# Patient Record
Sex: Female | Born: 1975 | Race: Asian | Hispanic: No | Marital: Single | Smoking: Never smoker
Health system: Southern US, Community
[De-identification: ages and names within clinical notes are randomized; demographics above are authoritative.]

## PROBLEM LIST (undated history)

## (undated) DIAGNOSIS — R0600 Dyspnea, unspecified: Secondary | ICD-10-CM

## (undated) DIAGNOSIS — S0990XS Unspecified injury of head, sequela: Secondary | ICD-10-CM

## (undated) DIAGNOSIS — K219 Gastro-esophageal reflux disease without esophagitis: Secondary | ICD-10-CM

## (undated) DIAGNOSIS — G44309 Post-traumatic headache, unspecified, not intractable: Secondary | ICD-10-CM

## (undated) HISTORY — DX: Post-traumatic headache, unspecified, not intractable: G44.309

## (undated) HISTORY — PX: PLACEMENT OF BREAST IMPLANTS: SHX6334

## (undated) HISTORY — DX: Unspecified injury of head, sequela: S09.90XS

## (undated) HISTORY — DX: Gastro-esophageal reflux disease without esophagitis: K21.9

---

## 1999-02-03 ENCOUNTER — Inpatient Hospital Stay (HOSPITAL_COMMUNITY): Admission: AD | Admit: 1999-02-03 | Discharge: 1999-02-03 | Payer: Self-pay | Admitting: Obstetrics & Gynecology

## 1999-02-08 ENCOUNTER — Inpatient Hospital Stay (HOSPITAL_COMMUNITY): Admission: AD | Admit: 1999-02-08 | Discharge: 1999-02-08 | Payer: Self-pay | Admitting: Obstetrics and Gynecology

## 1999-10-30 ENCOUNTER — Encounter: Admission: RE | Admit: 1999-10-30 | Discharge: 1999-10-30 | Payer: Self-pay | Admitting: Obstetrics & Gynecology

## 1999-11-12 ENCOUNTER — Ambulatory Visit (HOSPITAL_COMMUNITY): Admission: RE | Admit: 1999-11-12 | Discharge: 1999-11-12 | Payer: Self-pay | Admitting: Obstetrics & Gynecology

## 1999-11-13 ENCOUNTER — Encounter: Admission: RE | Admit: 1999-11-13 | Discharge: 1999-11-13 | Payer: Self-pay | Admitting: Obstetrics & Gynecology

## 1999-11-27 ENCOUNTER — Encounter: Admission: RE | Admit: 1999-11-27 | Discharge: 1999-11-27 | Payer: Self-pay | Admitting: Obstetrics & Gynecology

## 1999-11-27 ENCOUNTER — Ambulatory Visit (HOSPITAL_COMMUNITY): Admission: RE | Admit: 1999-11-27 | Discharge: 1999-11-27 | Payer: Self-pay

## 1999-12-03 ENCOUNTER — Ambulatory Visit (HOSPITAL_COMMUNITY): Admission: RE | Admit: 1999-12-03 | Discharge: 1999-12-03 | Payer: Self-pay | Admitting: Obstetrics & Gynecology

## 1999-12-18 ENCOUNTER — Encounter: Admission: RE | Admit: 1999-12-18 | Discharge: 1999-12-18 | Payer: Self-pay | Admitting: Obstetrics & Gynecology

## 1999-12-26 ENCOUNTER — Ambulatory Visit (HOSPITAL_COMMUNITY): Admission: RE | Admit: 1999-12-26 | Discharge: 1999-12-26 | Payer: Self-pay

## 2000-01-08 ENCOUNTER — Encounter (HOSPITAL_COMMUNITY): Admission: RE | Admit: 2000-01-08 | Discharge: 2000-04-07 | Payer: Self-pay | Admitting: Obstetrics & Gynecology

## 2000-01-08 ENCOUNTER — Encounter: Admission: RE | Admit: 2000-01-08 | Discharge: 2000-01-08 | Payer: Self-pay | Admitting: Obstetrics & Gynecology

## 2000-01-15 ENCOUNTER — Encounter: Admission: RE | Admit: 2000-01-15 | Discharge: 2000-01-15 | Payer: Self-pay | Admitting: Obstetrics & Gynecology

## 2000-01-22 ENCOUNTER — Encounter: Admission: RE | Admit: 2000-01-22 | Discharge: 2000-01-22 | Payer: Self-pay | Admitting: Obstetrics & Gynecology

## 2000-02-05 ENCOUNTER — Encounter: Admission: RE | Admit: 2000-02-05 | Discharge: 2000-02-05 | Payer: Self-pay | Admitting: Obstetrics & Gynecology

## 2000-02-19 ENCOUNTER — Encounter: Admission: RE | Admit: 2000-02-19 | Discharge: 2000-02-19 | Payer: Self-pay | Admitting: Obstetrics & Gynecology

## 2000-02-24 ENCOUNTER — Encounter: Admission: RE | Admit: 2000-02-24 | Discharge: 2000-02-24 | Payer: Self-pay | Admitting: Obstetrics & Gynecology

## 2000-03-04 ENCOUNTER — Encounter: Admission: RE | Admit: 2000-03-04 | Discharge: 2000-03-04 | Payer: Self-pay | Admitting: Obstetrics & Gynecology

## 2000-03-05 ENCOUNTER — Ambulatory Visit (HOSPITAL_COMMUNITY): Admission: RE | Admit: 2000-03-05 | Discharge: 2000-03-05 | Payer: Self-pay | Admitting: Obstetrics

## 2000-03-18 ENCOUNTER — Encounter: Admission: RE | Admit: 2000-03-18 | Discharge: 2000-03-18 | Payer: Self-pay | Admitting: Obstetrics & Gynecology

## 2000-03-25 ENCOUNTER — Encounter: Admission: RE | Admit: 2000-03-25 | Discharge: 2000-03-25 | Payer: Self-pay | Admitting: Obstetrics & Gynecology

## 2000-03-25 ENCOUNTER — Inpatient Hospital Stay (HOSPITAL_COMMUNITY): Admission: AD | Admit: 2000-03-25 | Discharge: 2000-03-29 | Payer: Self-pay | Admitting: Obstetrics

## 2000-03-27 ENCOUNTER — Encounter: Payer: Self-pay | Admitting: *Deleted

## 2000-04-01 ENCOUNTER — Encounter: Admission: RE | Admit: 2000-04-01 | Discharge: 2000-04-01 | Payer: Self-pay | Admitting: Obstetrics & Gynecology

## 2000-04-08 ENCOUNTER — Encounter: Admission: RE | Admit: 2000-04-08 | Discharge: 2000-04-08 | Payer: Self-pay | Admitting: Obstetrics & Gynecology

## 2000-04-19 ENCOUNTER — Observation Stay (HOSPITAL_COMMUNITY): Admission: AD | Admit: 2000-04-19 | Discharge: 2000-04-20 | Payer: Self-pay | Admitting: Obstetrics & Gynecology

## 2000-04-21 ENCOUNTER — Observation Stay (HOSPITAL_COMMUNITY): Admission: AD | Admit: 2000-04-21 | Discharge: 2000-04-21 | Payer: Self-pay | Admitting: Obstetrics & Gynecology

## 2000-04-23 ENCOUNTER — Inpatient Hospital Stay (HOSPITAL_COMMUNITY): Admission: AD | Admit: 2000-04-23 | Discharge: 2000-04-23 | Payer: Self-pay | Admitting: *Deleted

## 2000-04-25 ENCOUNTER — Inpatient Hospital Stay (HOSPITAL_COMMUNITY): Admission: AD | Admit: 2000-04-25 | Discharge: 2000-04-27 | Payer: Self-pay | Admitting: Obstetrics

## 2001-11-01 ENCOUNTER — Other Ambulatory Visit: Admission: RE | Admit: 2001-11-01 | Discharge: 2001-11-01 | Payer: Self-pay | Admitting: Obstetrics & Gynecology

## 2001-11-05 ENCOUNTER — Encounter: Payer: Self-pay | Admitting: Obstetrics & Gynecology

## 2001-11-05 ENCOUNTER — Ambulatory Visit (HOSPITAL_COMMUNITY): Admission: RE | Admit: 2001-11-05 | Discharge: 2001-11-05 | Payer: Self-pay | Admitting: Obstetrics & Gynecology

## 2001-11-10 ENCOUNTER — Ambulatory Visit (HOSPITAL_COMMUNITY): Admission: RE | Admit: 2001-11-10 | Discharge: 2001-11-10 | Payer: Self-pay | Admitting: Obstetrics & Gynecology

## 2001-11-25 ENCOUNTER — Ambulatory Visit (HOSPITAL_COMMUNITY): Admission: RE | Admit: 2001-11-25 | Discharge: 2001-11-25 | Payer: Self-pay | Admitting: Obstetrics & Gynecology

## 2001-11-25 ENCOUNTER — Encounter: Payer: Self-pay | Admitting: Obstetrics & Gynecology

## 2001-12-08 ENCOUNTER — Ambulatory Visit (HOSPITAL_COMMUNITY): Admission: RE | Admit: 2001-12-08 | Discharge: 2001-12-08 | Payer: Self-pay | Admitting: Obstetrics & Gynecology

## 2001-12-08 ENCOUNTER — Encounter: Payer: Self-pay | Admitting: Obstetrics & Gynecology

## 2002-01-12 ENCOUNTER — Ambulatory Visit (HOSPITAL_COMMUNITY): Admission: RE | Admit: 2002-01-12 | Discharge: 2002-01-12 | Payer: Self-pay | Admitting: Obstetrics & Gynecology

## 2002-01-12 ENCOUNTER — Encounter: Payer: Self-pay | Admitting: Obstetrics & Gynecology

## 2002-01-24 ENCOUNTER — Inpatient Hospital Stay (HOSPITAL_COMMUNITY): Admission: AD | Admit: 2002-01-24 | Discharge: 2002-01-29 | Payer: Self-pay | Admitting: Obstetrics & Gynecology

## 2002-01-28 ENCOUNTER — Encounter: Payer: Self-pay | Admitting: Obstetrics & Gynecology

## 2002-02-09 ENCOUNTER — Encounter: Payer: Self-pay | Admitting: Obstetrics & Gynecology

## 2002-02-09 ENCOUNTER — Ambulatory Visit (HOSPITAL_COMMUNITY): Admission: RE | Admit: 2002-02-09 | Discharge: 2002-02-09 | Payer: Self-pay | Admitting: Obstetrics & Gynecology

## 2002-03-21 ENCOUNTER — Inpatient Hospital Stay (HOSPITAL_COMMUNITY): Admission: AD | Admit: 2002-03-21 | Discharge: 2002-03-21 | Payer: Self-pay | Admitting: Obstetrics

## 2002-03-28 ENCOUNTER — Observation Stay (HOSPITAL_COMMUNITY): Admission: AD | Admit: 2002-03-28 | Discharge: 2002-03-28 | Payer: Self-pay | Admitting: Obstetrics

## 2002-04-09 ENCOUNTER — Inpatient Hospital Stay (HOSPITAL_COMMUNITY): Admission: AD | Admit: 2002-04-09 | Discharge: 2002-04-12 | Payer: Self-pay | Admitting: Obstetrics

## 2010-03-17 HISTORY — PX: AUGMENTATION MAMMAPLASTY: SUR837

## 2015-08-08 ENCOUNTER — Encounter: Payer: Self-pay | Admitting: Gastroenterology

## 2015-08-22 ENCOUNTER — Ambulatory Visit (INDEPENDENT_AMBULATORY_CARE_PROVIDER_SITE_OTHER): Payer: BLUE CROSS/BLUE SHIELD | Admitting: Neurology

## 2015-08-22 ENCOUNTER — Encounter: Payer: Self-pay | Admitting: Neurology

## 2015-08-22 VITALS — BP 110/80 | HR 88 | Ht 61.0 in | Wt 134.4 lb

## 2015-08-22 DIAGNOSIS — R079 Chest pain, unspecified: Secondary | ICD-10-CM

## 2015-08-22 DIAGNOSIS — R51 Headache: Secondary | ICD-10-CM | POA: Diagnosis not present

## 2015-08-22 DIAGNOSIS — R42 Dizziness and giddiness: Secondary | ICD-10-CM | POA: Diagnosis not present

## 2015-08-22 DIAGNOSIS — H539 Unspecified visual disturbance: Secondary | ICD-10-CM

## 2015-08-22 DIAGNOSIS — R110 Nausea: Secondary | ICD-10-CM | POA: Diagnosis not present

## 2015-08-22 DIAGNOSIS — R55 Syncope and collapse: Secondary | ICD-10-CM

## 2015-08-22 DIAGNOSIS — R519 Headache, unspecified: Secondary | ICD-10-CM

## 2015-08-22 NOTE — Patient Instructions (Addendum)
Remember to drink plenty of fluid, eat healthy meals and do not skip any meals. Try to eat protein with a every meal and eat a healthy snack such as fruit or nuts in between meals. Try to keep a regular sleep-wake schedule and try to exercise daily, particularly in the form of walking, 20-30 minutes a day, if you can.   As far as diagnostic testing: MRI brain, labs, eeg, holter monitor  Our phone number is [phone]. We also have an after hours call service for urgent matters and there is a physician on-call for urgent questions. For any emergencies you know to call 911 or go to the nearest emergency room

## 2015-08-22 NOTE — Progress Notes (Signed)
[facility]    Provider:  Dr Jaynee Eagles Referring Provider: Shanon Rosser, PA-C Primary Care Physician:  Shanon Rosser PA-C  CC:  Syncope  HPI:  Tracey Coleman is a 40 y.o. female here as a referral from Dr. Laverta Baltimore for near-syncope. Past medical history near syncope, headaches. hey have been worsening 2-3 months ago. She has had several episodes, maybe once a month. No previous history of seizures or alteration of consciousness. She was sitting down at her desk and she couldn't see anybody, her vision goes black, or she can see but she can't speak, like she just woke up from being sick, she does not fall, tries to reposition herself, she is frozen, she feels very dizzy, she tries to hold onto where she is, starts sweating, she has chest pain during the episode, then she tales a deep breath. Then she feels shaky, nervous, weak. No confusion afterwards, no loss of consciousness. Episodes last a minute and then resolve. No triggers. Her boyfriend is here and says in 2003 they went to a store and she had to hold onto her boyfriend, she wasn't herself for 5-6 seconds, she was just standing and holding onto her boyfriend and said she couldn't see anything, then she was fine. She has headaches and takes excedrin and she has them every day. The headaches are in the back of the head. Her boyfriend provides much information. Headaches last for 2-3 hours. She has some headaches in the middle of the night. No light sensitivity with the headaches, no sound sensitivity, no nausea or vomiting with the headaches. She has blurry vision. She is supposed to wear her glasses but doesn't. She has not had a cardiology workup. Headaches are not associated with pre-syncopal episodes but she is having them every other day. She has nausea, flushing with  near syncopal episodes. No other focal neurologic deficits.   Review of Systems: Patient complains of symptoms per HPI as well as the following symptoms: Fevers chills,  blurred vision, double vision, chest pain, cough, feeling cold, diarrhea, constipation, trouble swallowing, joint pain, cramps, aching muscles, headache, numbness, weakness, difficulty swallowing, dizziness. Pertinent negatives per HPI. All others negative.   Social History   Social History  . Marital Status: Single    Spouse Name: N/A  . Number of Children: 3  . Years of Education: 11   Occupational History  . [employer]    Social History Main Topics  . Smoking status: Never Smoker   . Smokeless tobacco: Not on file  . Alcohol Use: No  . Drug Use: No  . Sexual Activity: Not on file   Other Topics Concern  . Not on file   Social History Narrative   Lives with parents and kids   Caffeine use: Drink coffee (once per day)    Family History  Problem Relation Age of Onset  . Diabetes Mother   . Hypertension Mother   . Cancer Brother   . Seizures Neg Hx   . Migraines Neg Hx     Past Medical History  Diagnosis Date  . Headaches due to old head injury     Past Surgical History  Procedure Laterality Date  . No past surgeries      Current Outpatient Prescriptions  Medication Sig Dispense Refill  . ergocalciferol (VITAMIN D2) 50000 units capsule Take 50,000 Units by mouth once a week.    . Esomeprazole Magnesium (NEXIUM PO) Take 22.3 mg by mouth daily.     No current facility-administered  medications for this visit.    Allergies as of 08/22/2015  . (No Known Allergies)    Vitals: BP 110/80 mmHg  Pulse 88  Ht 5\' 1"  (1.549 m)  Wt 134 lb 6.4 oz (60.963 kg)  BMI 25.41 kg/m2  SpO2 98% Last Weight:  Wt Readings from Last 1 Encounters:  08/22/15 134 lb 6.4 oz (60.963 kg)   Last Height:   Ht Readings from Last 1 Encounters:  08/22/15 5\' 1"  (1.549 m)     Physical exam: Exam: Gen: NAD, conversant, well nourised                    CV: RRR, no MRG. No Carotid Bruits. No peripheral edema, warm, nontender Eyes: Conjunctivae clear without exudates or  hemorrhage  Neuro: Detailed Neurologic Exam  Speech:    Speech is normal; fluent and spontaneous with normal comprehension.  Cognition:    The patient is oriented to person, place, and time;     recent and remote memory intact;     language fluent;     normal attention, concentration,     fund of knowledge Cranial Nerves:    The pupils are equal, round, and reactive to light. The fundi are normal and spontaneous venous pulsations are present. Visual fields are full to finger confrontation. Extraocular movements are intact. Trigeminal sensation is intact and the muscles of mastication are normal. The face is symmetric. The palate elevates in the midline. Hearing intact. Voice is normal. Shoulder shrug is normal. The tongue has normal motion without fasciculations.   Coordination:    Normal finger to nose and heel to shin. Normal rapid alternating movements.   Gait:    Heel-toe and tandem gait are normal.   Motor Observation:    No asymmetry, no atrophy, and no involuntary movements noted. Tone:    Normal muscle tone.    Posture:    Posture is normal. normal erect    Strength:    Strength is V/V in the upper and lower limbs.      Sensation: intact to LT     Reflex Exam:  DTR's:    Deep tendon reflexes in the upper and lower extremities are normal bilaterally.   Toes:    The toes are downgoing bilaterally.   Clonus:    Clonus is absent.   Assessment/Plan:  40 year old patient with pre-syncopal episodes with dizziness, chest pain, sweating, vision changes where she almost loses consciousness. I recommend that her primary care have her evaluated for cardiac causes in the meantime I will order a holter monitor for 30 days. Advised her to follow-up with primary care asap for cardiac evaluation as pcp sees clinically warranted, possibly cardiology referal. I will evaluate her for seizures however my suspicion is low.  Asked patient to monitor her fluid intake, keep a diary on  whether she had eaten before the episodes or other causes of pre-syncope. I will order a holter monitor for 30 days.   As far as diagnostic testing: MRI brain, labs, eeg, holter monitor  Patient is unable to drive, operate heavy machinery, perform activities at heights or participate in water activities until 6 months event free  Cc: Long, scott  Sarina Ill, MD  [facility] [address] [place], [place] [address]  Phone [phone] Fax [phone]

## 2015-08-23 LAB — COMPREHENSIVE METABOLIC PANEL
ALT: 6 IU/L (ref 0–32)
AST: 17 IU/L (ref 0–40)
Albumin/Globulin Ratio: 1.3 (ref 1.2–2.2)
Albumin: 4.4 g/dL (ref 3.5–5.5)
Alkaline Phosphatase: 68 IU/L (ref 39–117)
BUN/Creatinine Ratio: 15 (ref 9–23)
BUN: 9 mg/dL (ref 6–20)
Bilirubin Total: <0.2 mg/dL (ref 0.0–1.2)
CALCIUM: 9.6 mg/dL (ref 8.7–10.2)
CHLORIDE: 97 mmol/L (ref 96–106)
CO2: 26 mmol/L (ref 18–29)
Creatinine, Ser: 0.62 mg/dL (ref 0.57–1.00)
GFR, EST AFRICAN AMERICAN: 132 mL/min/{1.73_m2} (ref >59)
GFR, EST NON AFRICAN AMERICAN: 115 mL/min/{1.73_m2} (ref >59)
GLUCOSE: 96 mg/dL (ref 65–99)
Globulin, Total: 3.4 g/dL (ref 1.5–4.5)
Potassium: 3.9 mmol/L (ref 3.5–5.2)
Sodium: 138 mmol/L (ref 134–144)
TOTAL PROTEIN: 7.8 g/dL (ref 6.0–8.5)

## 2015-08-23 LAB — CBC
Hematocrit: 41.4 % (ref 34.0–46.6)
Hemoglobin: 13.7 g/dL (ref 11.1–15.9)
MCH: 28.6 pg (ref 26.6–33.0)
MCHC: 33.1 g/dL (ref 31.5–35.7)
MCV: 86 fL (ref 79–97)
PLATELETS: 296 10*3/uL (ref 150–379)
RBC: 4.79 x10E6/uL (ref 3.77–5.28)
RDW: 14.2 % (ref 12.3–15.4)
WBC: 9.2 10*3/uL (ref 3.4–10.8)

## 2015-08-23 LAB — THYROID PANEL WITH TSH
FREE THYROXINE INDEX: 1.8 (ref 1.2–4.9)
T3 UPTAKE RATIO: 22 % — AB (ref 24–39)
T4, Total: 8.2 ug/dL (ref 4.5–12.0)
TSH: 1.56 u[IU]/mL (ref 0.450–4.500)

## 2015-08-25 ENCOUNTER — Encounter: Payer: Self-pay | Admitting: Neurology

## 2015-08-25 DIAGNOSIS — R55 Syncope and collapse: Secondary | ICD-10-CM | POA: Insufficient documentation

## 2015-08-27 ENCOUNTER — Ambulatory Visit (INDEPENDENT_AMBULATORY_CARE_PROVIDER_SITE_OTHER): Payer: BLUE CROSS/BLUE SHIELD

## 2015-08-27 DIAGNOSIS — R079 Chest pain, unspecified: Secondary | ICD-10-CM | POA: Diagnosis not present

## 2015-08-27 DIAGNOSIS — R55 Syncope and collapse: Secondary | ICD-10-CM | POA: Diagnosis not present

## 2015-08-28 ENCOUNTER — Telehealth: Payer: Self-pay | Admitting: *Deleted

## 2015-08-28 NOTE — Telephone Encounter (Signed)
-----   Message from Melvenia Beam, MD sent at 08/23/2015  7:31 AM EDT ----- Labs look fine. Her kidney function tests are normal, liver function tests normal, her white blood cells, hemoglobin are normal (not anemic), her thyroid function looks good. Please discuss detail with patient as documented here, thanks

## 2015-08-28 NOTE — Telephone Encounter (Signed)
Called and spoke to pt about lab results in detail per Dr Jaynee Eagles note. She is currently hooked up to heart monitor. She has yet to schedule MRI. Told her to call back if she is not called this week to schedule. I want to ensure she has appt. She verbalized understanding.

## 2015-09-05 ENCOUNTER — Ambulatory Visit (INDEPENDENT_AMBULATORY_CARE_PROVIDER_SITE_OTHER): Payer: BLUE CROSS/BLUE SHIELD

## 2015-09-05 DIAGNOSIS — R42 Dizziness and giddiness: Secondary | ICD-10-CM

## 2015-09-05 DIAGNOSIS — H539 Unspecified visual disturbance: Secondary | ICD-10-CM | POA: Diagnosis not present

## 2015-09-05 DIAGNOSIS — R51 Headache: Secondary | ICD-10-CM | POA: Diagnosis not present

## 2015-09-05 DIAGNOSIS — R079 Chest pain, unspecified: Secondary | ICD-10-CM

## 2015-09-05 DIAGNOSIS — R55 Syncope and collapse: Secondary | ICD-10-CM

## 2015-09-05 DIAGNOSIS — R519 Headache, unspecified: Secondary | ICD-10-CM

## 2015-09-05 DIAGNOSIS — R110 Nausea: Secondary | ICD-10-CM

## 2015-09-06 MED ORDER — GADOPENTETATE DIMEGLUMINE 469.01 MG/ML IV SOLN
13.0000 mL | Freq: Once | INTRAVENOUS | Status: AC | PRN
Start: 2015-09-06 — End: ?

## 2015-09-10 ENCOUNTER — Telehealth: Payer: Self-pay | Admitting: *Deleted

## 2015-09-10 NOTE — Telephone Encounter (Signed)
-----   Message from Melvenia Beam, MD sent at 09/07/2015 10:03 AM EDT ----- MRI of the brain is normal thanks

## 2015-09-10 NOTE — Telephone Encounter (Signed)
Tried calling pt. VM not set up, unable to LVM. IF she calls, ok to inform MRI brain normal per Dr Jaynee Eagles.

## 2015-09-12 NOTE — Telephone Encounter (Signed)
Tried calling pt number again. Went to VM, unable to LVM d/t VM not set up. Called pt boyfriend, Laverna Peace who is listed on DPR form. He handed phone to pt. I relayed that MRI brain normal per Dr Jaynee Eagles. She verbalized understanding. I informed her that her VM not set up. She stated she was working "up front and could not reach her phone"

## 2015-09-25 ENCOUNTER — Ambulatory Visit (INDEPENDENT_AMBULATORY_CARE_PROVIDER_SITE_OTHER): Payer: BLUE CROSS/BLUE SHIELD | Admitting: Neurology

## 2015-09-25 DIAGNOSIS — R110 Nausea: Secondary | ICD-10-CM

## 2015-09-25 DIAGNOSIS — R079 Chest pain, unspecified: Secondary | ICD-10-CM

## 2015-09-25 DIAGNOSIS — R42 Dizziness and giddiness: Secondary | ICD-10-CM

## 2015-09-25 DIAGNOSIS — R55 Syncope and collapse: Secondary | ICD-10-CM | POA: Diagnosis not present

## 2015-09-25 DIAGNOSIS — R519 Headache, unspecified: Secondary | ICD-10-CM

## 2015-09-25 DIAGNOSIS — H539 Unspecified visual disturbance: Secondary | ICD-10-CM

## 2015-09-25 DIAGNOSIS — R51 Headache: Secondary | ICD-10-CM

## 2015-09-25 NOTE — Procedures (Signed)
    History:  Tracey Coleman is a 40 year old patient with a history of near-syncope associated with headaches, the episodes have worsened over the last 2-3 months. The patient is averaging about one episode a month. She is being evaluated for these episodes.  This is a routine EEG. No skull defects are noted. Medications include vitamin D supplementation and Nexium.   EEG classification: Normal awake  Description of the recording: The background rhythms of this recording consists of a fairly well modulated medium amplitude alpha rhythm of 8 Hz that is reactive to eye opening and closure. As the record progresses, the patient appears to remain in the waking state throughout the recording. Photic stimulation was performed, resulting in a bilateral and symmetric photic driving response. Hyperventilation was also performed, resulting in a minimal buildup of the background rhythm activities without significant slowing seen. At no time during the recording does there appear to be evidence of spike or spike wave discharges or evidence of focal slowing. EKG monitor shows no evidence of cardiac rhythm abnormalities with a heart rate of 78.  Impression: This is a normal EEG recording in the waking state. No evidence of ictal or interictal discharges are seen.

## 2015-09-26 ENCOUNTER — Telehealth: Payer: Self-pay | Admitting: *Deleted

## 2015-09-26 NOTE — Telephone Encounter (Signed)
-----   Message from Melvenia Beam, MD sent at 09/25/2015  6:09 PM EDT ----- eeg normal, no abnormal electrical brain activity or seizure activity.

## 2015-09-26 NOTE — Telephone Encounter (Signed)
Tried calling pt on home number. VM not set up, unable to LVM. Tried number listed for Rosanna Randy, but number has been disconnected.  Tried calling number for boyfriend Laverna Peace), but VM not set up, unable to LVM  If pt calls, ok per DR Jaynee Eagles to inform EEG normal, no abnormal electrical brain activity or seizure activity.

## 2015-09-27 ENCOUNTER — Encounter: Payer: Self-pay | Admitting: *Deleted

## 2015-09-27 NOTE — Telephone Encounter (Signed)
Sent letter to pt since I could not reach pt.

## 2015-10-10 ENCOUNTER — Encounter (INDEPENDENT_AMBULATORY_CARE_PROVIDER_SITE_OTHER): Payer: Self-pay

## 2015-10-10 ENCOUNTER — Ambulatory Visit (INDEPENDENT_AMBULATORY_CARE_PROVIDER_SITE_OTHER): Payer: BLUE CROSS/BLUE SHIELD | Admitting: Gastroenterology

## 2015-10-10 ENCOUNTER — Other Ambulatory Visit (INDEPENDENT_AMBULATORY_CARE_PROVIDER_SITE_OTHER): Payer: BLUE CROSS/BLUE SHIELD

## 2015-10-10 ENCOUNTER — Encounter: Payer: Self-pay | Admitting: Gastroenterology

## 2015-10-10 VITALS — BP 102/62 | HR 62 | Ht 61.0 in | Wt 133.0 lb

## 2015-10-10 DIAGNOSIS — R1310 Dysphagia, unspecified: Secondary | ICD-10-CM

## 2015-10-10 DIAGNOSIS — R1110 Vomiting, unspecified: Secondary | ICD-10-CM

## 2015-10-10 DIAGNOSIS — Z800 Family history of malignant neoplasm of digestive organs: Secondary | ICD-10-CM | POA: Diagnosis not present

## 2015-10-10 DIAGNOSIS — R142 Eructation: Secondary | ICD-10-CM

## 2015-10-10 DIAGNOSIS — K219 Gastro-esophageal reflux disease without esophagitis: Secondary | ICD-10-CM

## 2015-10-10 DIAGNOSIS — R140 Abdominal distension (gaseous): Secondary | ICD-10-CM

## 2015-10-10 DIAGNOSIS — K5900 Constipation, unspecified: Secondary | ICD-10-CM

## 2015-10-10 LAB — IGA: IGA: 205 mg/dL (ref 68–378)

## 2015-10-10 MED ORDER — OMEPRAZOLE 40 MG PO CPDR: 40.0000 mg | DELAYED_RELEASE_CAPSULE | Freq: Every day | ORAL | 3 refills | Status: DC

## 2015-10-10 NOTE — Progress Notes (Signed)
[facility]    WP:[id number]    12-21-1975  Primary Care Physician:[facility]  Referring Physician: Shanon Rosser, PA-C [place], [place] [address]  Chief complaint:  Epigastric pain, burping, abdominal fullness, constipation  HPI: 40 year old [place] female here with complaints of intermittent epigastric pain associated with constant burping, abdominal fullness for the past 1-2 years and she feels its progressively getting worse. She also has nausea with intermittent vomiting mostly clear bitter tasting fluid in the morning. Denies any hematemesis or melena . She has had occasional difficulty swallowing mostly with solid food , denies any choking sensation or food impaction episodes. Her weight has been stable. She also has history of constipation with irregular bowel habits and sensation of incomplete evacuation. Denies any blood per rectum. Her mother recently got diagnosed with gastric cancer. No family history of colon cancer. She has been taking over-the-counter Nexium with no significant improvement. Denies chronic nsaids use or alcohol abuse.    Outpatient Encounter Prescriptions as of 10/10/2015  Medication Sig  . ergocalciferol (VITAMIN D2) 50000 units capsule Take 50,000 Units by mouth once a week.  . Esomeprazole Magnesium (NEXIUM PO) Take 22.3 mg by mouth daily.  Marland Kitchen omeprazole (PRILOSEC) 40 MG capsule Take 1 capsule (40 mg total) by mouth daily.   Facility-Administered Encounter Medications as of 10/10/2015  Medication  . gadopentetate dimeglumine (MAGNEVIST) injection 13 mL    Allergies as of 10/10/2015  . (No Known Allergies)    Past Medical History:  Diagnosis Date  . Headaches due to old head injury     Past Surgical History:  Procedure Laterality Date  . PLACEMENT OF BREAST IMPLANTS      Family History  Problem Relation Age of Onset  . Diabetes Mother   . Hypertension Mother   . Cancer Brother   . Seizures  Neg Hx   . Migraines Neg Hx     Social History   Social History  . Marital status: Single    Spouse name: N/A  . Number of children: 3  . Years of education: 33   Occupational History  . [employer]    Social History Main Topics  . Smoking status: Never Smoker  . Smokeless tobacco: Never Used  . Alcohol use No  . Drug use: No  . Sexual activity: Not on file   Other Topics Concern  . Not on file   Social History Narrative   Lives with parents and kids   Caffeine use: Drink coffee (once per day)      Review of systems: Review of Systems  Constitutional: Negative for fever and chills.  HENT: Negative.   Eyes: Negative for blurred vision.  Respiratory: Negative for cough, shortness of breath and wheezing.   Cardiovascular: Negative for chest pain and palpitations.  Gastrointestinal: as per HPI Genitourinary: Negative for dysuria, urgency, frequency and hematuria.  Musculoskeletal: Negative for myalgias, back pain and joint pain.  Skin: Negative for itching and rash.  Neurological: Negative for dizziness, tremors, focal weakness, seizures and loss of consciousness.  Endo/Heme/Allergies: Negative for environmental allergies.  Psychiatric/Behavioral: Negative for depression, suicidal ideas and hallucinations.  All other systems reviewed and are negative.   Physical Exam: Vitals:   10/10/15 0830  BP: 102/62  Pulse: 62   Gen:      No acute distress HEENT:  EOMI, sclera anicteric Neck:     No masses; no thyromegaly Lungs:  Clear to auscultation bilaterally; normal respiratory effort CV:         Regular rate and rhythm; no murmurs Abd:      + bowel sounds; soft, non-tender; no palpable masses, no distension Ext:    No edema; adequate peripheral perfusion Skin:      Warm and dry; no rash Neuro: alert and oriented x 3 Psych: normal mood and affect  Data Reviewed:Reviewed chart in epic   Assessment and Plan/Recommendations:  40 year old female here with  complaints of intermittent epigastric pain associated with constant burping, intermittent dysphagia to solids, abdominal fullness, nausea and vomiting which all could be related to uncontrolled gastroesophageal reflux disease We'll send a prescription strength PPI, omeprazole 40 mg daily, 30 minutes Before breakfast Small frequent meals and follow antireflux measures Phazyme as needed for excessive bloating and burping We'll schedule for endoscopy for evaluation The risks and benefits as well as alternatives of endoscopic procedure(s) have been discussed and reviewed. All questions answered. The patient agrees to proceed. Start Benefiber 1 tablespoon 3 times a day with meals for constipation Return in 4 months  Greater than 50% of the time used for counseling as well as treatment plan and follow-up. She had multiple questions which were answered to her satisfaction      K. Denzil Magnuson , MD [phone] Mon-Fri 8a-5p [phone] after 5p, weekends, holidays  CC: Long, [place], PA-C

## 2015-10-10 NOTE — Patient Instructions (Addendum)
You have been scheduled for an endoscopy. Please follow written instructions given to you at your visit today. If you use inhalers (even only as needed), please bring them with you on the day of your procedure. Your physician has requested that you go to www.startemmi.com and enter the access code given to you at your visit today. This web site gives a general overview about your procedure. However, you should still follow specific instructions given to you by our office regarding your preparation for the procedure.  Go to the basement for labs today  We have sent omeprazole to your pharmacy Use Benefiber 1 tablespoon three times a day Use Phazyme 1 capsule four times a day as needed  Follow up in 43 months 40 year old female 40 year old [place] female

## 2015-10-11 LAB — TISSUE TRANSGLUTAMINASE, IGA: TISSUE TRANSGLUTAMINASE AB, IGA: 1 U/mL (ref <4)

## 2015-10-15 ENCOUNTER — Ambulatory Visit (AMBULATORY_SURGERY_CENTER): Payer: BLUE CROSS/BLUE SHIELD | Admitting: Gastroenterology

## 2015-10-15 ENCOUNTER — Encounter: Payer: Self-pay | Admitting: Gastroenterology

## 2015-10-15 VITALS — BP 122/75 | HR 75 | Temp 98.0°F | Resp 15 | Ht 61.0 in | Wt 133.0 lb

## 2015-10-15 DIAGNOSIS — K2970 Gastritis, unspecified, without bleeding: Secondary | ICD-10-CM

## 2015-10-15 DIAGNOSIS — K2990 Gastroduodenitis, unspecified, without bleeding: Secondary | ICD-10-CM

## 2015-10-15 DIAGNOSIS — K219 Gastro-esophageal reflux disease without esophagitis: Secondary | ICD-10-CM | POA: Diagnosis not present

## 2015-10-15 MED ORDER — SODIUM CHLORIDE 0.9 % IV SOLN: 500.0000 mL | INTRAVENOUS | Status: DC

## 2015-10-15 NOTE — Patient Instructions (Signed)
YOU HAD AN ENDOSCOPIC PROCEDURE TODAY AT THE [facility]:   Refer to the procedure report that was given to you for any specific questions about what was found during the examination.  If the procedure report does not answer your questions, please call your gastroenterologist to clarify.  If you requested that your care partner not be given the details of your procedure findings, then the procedure report has been included in a sealed envelope for you to review at your convenience later.  YOU SHOULD EXPECT: Some feelings of bloating in the abdomen. Passage of more gas than usual.  Walking can help get rid of the air that was put into your GI tract during the procedure and reduce the bloating. If you had a lower endoscopy (such as a colonoscopy or flexible sigmoidoscopy) you may notice spotting of blood in your stool or on the toilet paper. If you underwent a bowel prep for your procedure, you may not have a normal bowel movement for a few days.  Please Note:  You might notice some irritation and congestion in your nose or some drainage.  This is from the oxygen used during your procedure.  There is no need for concern and it should clear up in a day or so.  SYMPTOMS TO REPORT IMMEDIATELY:   Following lower endoscopy (colonoscopy or flexible sigmoidoscopy):  Excessive amounts of blood in the stool  Significant tenderness or worsening of abdominal pains  Swelling of the abdomen that is new, acute  Fever of 100F or higher   Following upper endoscopy (EGD)  Vomiting of blood or coffee ground material  New chest pain or pain under the shoulder blades  Painful or persistently difficult swallowing  New shortness of breath  Fever of 100F or higher  Black, tarry-looking stools  For urgent or emergent issues, a gastroenterologist can be reached at any hour by calling [phone].   DIET: Your first meal following the procedure should be a small meal and then it is ok to progress to  your normal diet. Heavy or fried foods are harder to digest and may make you feel nauseous or bloated.  Likewise, meals heavy in dairy and vegetables can increase bloating.  Drink plenty of fluids but you should avoid alcoholic beverages for 24 hours.  ACTIVITY:  You should plan to take it easy for the rest of today and you should NOT DRIVE or use heavy machinery until tomorrow (because of the sedation medicines used during the test).    FOLLOW UP: Our staff will call the number listed on your records the next business day following your procedure to check on you and address any questions or concerns that you may have regarding the information given to you following your procedure. If we do not reach you, we will leave a message.  However, if you are feeling well and you are not experiencing any problems, there is no need to return our call.  We will assume that you have returned to your regular daily activities without incident.  If any biopsies were taken you will be contacted by phone or by letter within the next 1-3 weeks.  Please call us at [phone] if you have not heard about the biopsies in 3 weeks.    SIGNATURES/CONFIDENTIALITY: You and/or your care partner have signed paperwork which will be entered into your electronic medical record.  These signatures attest to the fact that that the information above on your After Visit Summary has been reviewed   and is understood.  Full responsibility of the confidentiality of this discharge information lies with you and/or your care-partner.    Handout was given to your care partner on gastritis. No aspirin, aspirin products,  ibuprofen, naproxen, advil, motrin, aleve, or other non-steroidal anti-inflammatory drugs.  You may resume your other current medications today. Await biopsy results. Please call if any questions or concerns.

## 2015-10-15 NOTE — Progress Notes (Signed)
Report to PACU, RN, vss, BBS= Clear.  

## 2015-10-15 NOTE — Progress Notes (Signed)
Pt was drowsy, so once she was awake she was discharged.maw

## 2015-10-15 NOTE — Progress Notes (Signed)
No problems noted in the recovery room. maw 

## 2015-10-15 NOTE — Op Note (Signed)
[facility] Patient Name: Tracey Coleman Procedure Date: 10/15/2015 1:12 PM MRN: [id number] Endoscopist: Mauri Pole , MD Age: 40 Referring MD:  Date of Birth: 01/02/1976 Gender: Female Account #: [id number] Procedure:                Upper GI endoscopy Indications:              Esophageal reflux symptoms that persist despite                            appropriate therapy, Esophageal reflux symptoms                            that recur despite appropriate therapy,                            Helicobacter pylori status to be determined. Mother                            Gastric Cancer. Medicines:                Monitored Anesthesia Care Procedure:                Pre-Anesthesia Assessment:                           - Prior to the procedure, a History and Physical                            was performed, and patient medications and                            allergies were reviewed. The patient's tolerance of                            previous anesthesia was also reviewed. The risks                            and benefits of the procedure and the sedation                            options and risks were discussed with the patient.                            All questions were answered, and informed consent                            was obtained. Prior Anticoagulants: The patient has                            taken no previous anticoagulant or antiplatelet                            agents. ASA Grade Assessment: II - A patient with  mild systemic disease. After reviewing the risks                            and benefits, the patient was deemed in                            satisfactory condition to undergo the procedure.                           After obtaining informed consent, the endoscope was                            passed under direct vision. Throughout the                            procedure, the patient's blood pressure, pulse, and                             oxygen saturations were monitored continuously. The                            Model GIF-HQ190 [id number]) scope was introduced                            through the mouth, and advanced to the second part                            of duodenum. The upper GI endoscopy was                            accomplished without difficulty. The patient                            tolerated the procedure well. Scope In: Scope Out: Findings:                 The esophagus was normal. Regular Z-line, no                            evidence of stricture                           Diffuse mildly erythematous mucosa without bleeding                            was found in the entire examined stomach. Biopsies                            were taken with a cold forceps for Helicobacter                            pylori testing.                           The examined duodenum was normal. Complications:  No immediate complications. Estimated Blood Loss:     Estimated blood loss was minimal. Impression:               - Normal esophagus.                           - Erythematous mucosa in the stomach. Biopsied.                           - Normal examined duodenum. Recommendation:           - Patient has a contact number available for                            emergencies. The signs and symptoms of potential                            delayed complications were discussed with the                            patient. Return to normal activities tomorrow.                            Written discharge instructions were provided to the                            patient.                           - Resume previous diet.                           - Continue present medications.                           - Await pathology results.                           - No aspirin, ibuprofen, naproxen, or other                            non-steroidal anti-inflammatory drugs.                            - Return to GI clinic PRN. Mauri Pole, MD 10/15/2015 1:31:51 PM This report has been signed electronically.

## 2015-10-15 NOTE — Progress Notes (Signed)
Called to room to assist during endoscopic procedure.  Patient ID and intended procedure confirmed with present staff. Received instructions for my participation in the procedure from the performing physician.  

## 2015-10-16 ENCOUNTER — Telehealth: Payer: Self-pay | Admitting: *Deleted

## 2015-10-16 NOTE — Telephone Encounter (Signed)
  Follow up Call-  Call back number 10/15/2015  Post procedure Call Back phone  # [phone]  Permission to leave phone message Yes  Some recent data might be hidden     Patient questions:  Voice mail has not ben set up yet.

## 2015-10-26 ENCOUNTER — Encounter: Payer: Self-pay | Admitting: Gastroenterology

## 2015-10-29 ENCOUNTER — Other Ambulatory Visit: Payer: Self-pay

## 2015-10-29 ENCOUNTER — Telehealth: Payer: Self-pay | Admitting: Gastroenterology

## 2015-10-29 MED ORDER — PANTOPRAZOLE SODIUM 40 MG PO TBEC: 40.0000 mg | DELAYED_RELEASE_TABLET | Freq: Every day | ORAL | 3 refills | Status: DC

## 2015-10-29 NOTE — Telephone Encounter (Signed)
Patient is advised. Changed PPI to Pantoprazole 40 mg.

## 2015-10-29 NOTE — Telephone Encounter (Signed)
Discussed the definition of chronic gastritis. Confirmed she is taking Omeprazole 40 mg 30 minutes before her first meal of the day. She is avoiding spicy and high acid foods. Not taking any OTC pain medications. She does not feel the PPI is helping her very much and would like to try a different PPI. Please advise on this. Thank you.

## 2015-10-29 NOTE — Telephone Encounter (Signed)
We can switch to a different PPI equivalent dose (options are Protonix 40mg , Nexium 40mg  , Prevacid 30mg   or Aciphex 20mg  daily) . Insurance usually wouldn't let us switch to Drake unless failed rest of PPI. There is a possibility that she may have different out of pocket expense based on her insurance plan.  If she continues to have persistent epigastric pain, will need to consider Abdominal ultrasound to evaluate for possible gallbladder disease

## 2015-11-08 ENCOUNTER — Telehealth: Payer: Self-pay | Admitting: *Deleted

## 2015-11-08 NOTE — Telephone Encounter (Signed)
Per Dr Jaynee Eagles, spoke with patient and informed her that her Holter/cardiac monitor did not show any abnormalities; everything was normal. She verbalized understanding, appreciation.

## 2015-11-08 NOTE — Telephone Encounter (Signed)
Attempted to reach on home # [id number] area code; voice mailbox not set up.  Called other home #; got fax machine tone. Will attempt to reach later.

## 2015-11-08 NOTE — Telephone Encounter (Signed)
Pt returned call

## 2016-09-26 ENCOUNTER — Encounter: Payer: Self-pay | Admitting: Gastroenterology

## 2016-09-26 ENCOUNTER — Encounter (INDEPENDENT_AMBULATORY_CARE_PROVIDER_SITE_OTHER): Payer: Self-pay

## 2016-09-26 ENCOUNTER — Ambulatory Visit (INDEPENDENT_AMBULATORY_CARE_PROVIDER_SITE_OTHER): Payer: BLUE CROSS/BLUE SHIELD | Admitting: Gastroenterology

## 2016-09-26 VITALS — BP 92/68 | HR 81 | Ht 61.0 in | Wt 131.6 lb

## 2016-09-26 DIAGNOSIS — K5909 Other constipation: Secondary | ICD-10-CM | POA: Diagnosis not present

## 2016-09-26 DIAGNOSIS — R1012 Left upper quadrant pain: Secondary | ICD-10-CM

## 2016-09-26 DIAGNOSIS — R1013 Epigastric pain: Secondary | ICD-10-CM | POA: Diagnosis not present

## 2016-09-26 DIAGNOSIS — R140 Abdominal distension (gaseous): Secondary | ICD-10-CM

## 2016-09-26 MED ORDER — LINACLOTIDE 72 MCG PO CAPS: 72.0000 ug | ORAL_CAPSULE | Freq: Every day | ORAL | 3 refills | Status: DC

## 2016-09-26 MED ORDER — RANITIDINE HCL 150 MG PO TABS: 150.0000 mg | ORAL_TABLET | Freq: Two times a day (BID) | ORAL | 3 refills | Status: DC

## 2016-09-26 NOTE — Patient Instructions (Signed)
You have been scheduled for a gastric emptying scan at [facility] on 10/08/2016 at 7:30am. Please arrive at least 15 minutes prior to your appointment for registration. Please make certain not to have anything to eat or drink after midnight the night before your test. Hold all stomach medications (ex: Zofran, phenergan, Reglan) 48 hours prior to your test. If you need to reschedule your appointment, please contact radiology scheduling at [phone]. _____________________________________________________________________ A gastric-emptying study measures how long it takes for food to move through your stomach. There are several ways to measure stomach emptying. In the most common test, you eat food that contains a small amount of radioactive material. A scanner that detects the movement of the radioactive material is placed over your abdomen to monitor the rate at which food leaves your stomach. This test normally takes about 4 hours to complete. _____________________________________________________________________   Use FDGard over the counter 1 capsule three times a day as needed  We will send Zantac and Linzess to your pharmacy   Follow up in 3 months

## 2016-09-26 NOTE — Progress Notes (Signed)
Tracey Coleman    [id number]    1976-01-11  Primary Care [facility], [facility]  Referring Physician: [facility] [place],  [id number]  Chief complaint:  Bloating, dyspepsia, postprandial fullness, change in bowel habits  HPI: 42 year old female here with history of GERD here for follow-up visit. She continues to have intermittent bloating and left-sided abdominal discomfort and dyspepsia symptoms, worse in past few months. She took PPI for 2 months with some improvement. Her mother diagnosed with advanced stomach cancer and is currently in poor health. She has fullness in her left upper quadrant postprandial with tightness after a meal and she feels she can't eat much. Denies any vomiting but has intermittent nausea. Her bowel habits have changed and she is having episodes of alternating constipation with diarrhea. She is constipated with bowel movements every 2-3 days. Denies any nocturnal bowel movements or loose watery diarrhea. No blood per rectum or blood in stool.   EGD July 2017 showed mild gastritis otherwise was unremarkable. H. pylori negative.     Outpatient Encounter Prescriptions as of 09/26/2016  Medication Sig  . linaclotide (LINZESS) 72 MCG capsule Take 1 capsule (72 mcg total) by mouth daily before breakfast.  . ranitidine (ZANTAC) 150 MG tablet Take 1 tablet (150 mg total) by mouth 2 (two) times daily.  . [DISCONTINUED] ergocalciferol (VITAMIN D2) 50000 units capsule Take 50,000 Units by mouth once a week.  . [DISCONTINUED] pantoprazole (PROTONIX) 40 MG tablet Take 1 tablet (40 mg total) by mouth daily before breakfast. (Patient not taking: Reported on 09/26/2016)   Facility-Administered Encounter Medications as of 09/26/2016  Medication  . 0.9 %  sodium chloride infusion  . gadopentetate dimeglumine (MAGNEVIST) injection 13 mL    Allergies as of 09/26/2016  . (No Known Allergies)    Past  Medical History:  Diagnosis Date  . GERD (gastroesophageal reflux disease)   . Headaches due to old head injury     Past Surgical History:  Procedure Laterality Date  . PLACEMENT OF BREAST IMPLANTS      Family History  Problem Relation Age of Onset  . Diabetes Mother   . Hypertension Mother   . Cancer Brother   . Seizures Neg Hx   . Migraines Neg Hx     Social History   Social History  . Marital status: Single    Spouse name: N/A  . Number of children: 3  . Years of education: 66   Occupational History  . [employer]    Social History Main Topics  . Smoking status: Never Smoker  . Smokeless tobacco: Never Used  . Alcohol use No  . Drug use: No  . Sexual activity: Not on file   Other Topics Concern  . Not on file   Social History Narrative   Lives with parents and kids   Caffeine use: Drink coffee (once per day)      Review of systems: Review of Systems  Constitutional: Negative for fever and chills.  HENT: Negative.   Eyes: Negative for blurred vision.  Respiratory: Negative for cough, shortness of breath and wheezing.   Cardiovascular: Negative for chest pain and palpitations.  Gastrointestinal: as per HPI Genitourinary: Negative for dysuria, urgency, frequency and hematuria.  Musculoskeletal: Negative for myalgias, back pain and joint pain.  Skin: Negative for itching and rash.  Neurological: Negative for dizziness, tremors, focal weakness, seizures and loss of consciousness.  Endo/Heme/Allergies: Positive for seasonal allergies.  Psychiatric/Behavioral: Negative for depression, suicidal ideas and hallucinations.  All other systems reviewed and are negative.   Physical Exam: Vitals:   09/26/16 1002  BP: 92/68  Pulse: 81   Body mass index is 24.87 kg/m. Gen:      No acute distress HEENT:  EOMI, sclera anicteric Neck:     No masses; no thyromegaly Lungs:    Clear to auscultation bilaterally; normal respiratory effort CV:         Regular  rate and rhythm; no murmurs Abd:      + bowel sounds; soft, non-tender; no palpable masses, no distension Ext:    No edema; adequate peripheral perfusion Skin:      Warm and dry; no rash Neuro: alert and oriented x 3 Psych: normal mood and affect  Data Reviewed:  Reviewed labs, radiology imaging, old records and pertinent past GI work up   Assessment and Plan/Recommendations:  41 year old female with family history of gastric cancer, GERD here with complaints of left upper quadrant discomfort, postprandial fullness, bloating and dyspepsia symptoms along with alternating constipation and diarrhea  Will obtain gastric emptying study to exclude gastroparesis FD Gard 1 capsule 3 times daily as needed for dyspepsia symptoms Start Zantac 150 milligrams twice daily Small frequent meals and antireflux measures  Start Linzess 72 g daily to improve constipation and prevent alternating constipation and diarrhea Increase dietary fluid and fiber intake  Return in 3 months  25 minutes was spent face-to-face with the patient. Greater than 50% of the time used for counseling as well as treatment plan and follow-up. She had multiple questions which were answered to her satisfaction  K. Denzil Magnuson , MD [phone] Mon-Fri 8a-5p [phone] after 5p, weekends, holidays  CC: [facility], [facility]*

## 2016-10-08 ENCOUNTER — Ambulatory Visit (HOSPITAL_COMMUNITY)
Admission: RE | Admit: 2016-10-08 | Discharge: 2016-10-08 | Disposition: A | Discharge status: Home / Self Care | Payer: BLUE CROSS/BLUE SHIELD | Source: Ambulatory Visit | Attending: Gastroenterology | Admitting: Gastroenterology

## 2016-10-08 DIAGNOSIS — K5909 Other constipation: Secondary | ICD-10-CM

## 2016-10-08 DIAGNOSIS — R1012 Left upper quadrant pain: Secondary | ICD-10-CM | POA: Insufficient documentation

## 2016-10-08 DIAGNOSIS — R140 Abdominal distension (gaseous): Secondary | ICD-10-CM

## 2016-10-08 DIAGNOSIS — R1013 Epigastric pain: Secondary | ICD-10-CM | POA: Diagnosis present

## 2016-10-08 MED ORDER — TECHNETIUM TC 99M SULFUR COLLOID
1.9500 | Freq: Once | INTRAVENOUS | Status: AC | PRN
  Administered 2016-10-08: 1.95 via INTRAVENOUS

## 2016-11-19 NOTE — H&P (Signed)
[facility]  DICTATION # [id number] CSN# [id number]   Margarette Asal, MD 11/19/2016 11:34 AM

## 2016-11-20 NOTE — H&P (Signed)
NAME:  Tracey Coleman, Tracey Coleman                      ACCOUNT NO.:  MEDICAL RECORD NO.:  1  LOCATION:                                 FACILITY:  PHYSICIAN:  Laurianne Floresca M. Matthew Saras, M.D.    DATE OF BIRTH:  DATE OF ADMISSION: DATE OF DISCHARGE:                             HISTORY & PHYSICAL   CHIEF COMPLAINT:  Cervical dysplasia, leiomyoma, pelvic pain.  HISTORY OF PRESENT ILLNESS:  A 41 year old, G3, P3.  This patient has had worsening dysmenorrhea and pelvic pain over the last several years. Ultrasound dated July 18 demonstrated possible adenomyosis, two small fibroids and adnexa unremarkable.  Beyond that time, she was found to have an abnormal Pap smear.  Colposcopy was satisfactory with biopsy showing at least moderate dysplasia.  After discussing treatment option, she presents at this time for definitive LAVH, bilateral salpingectomy. This procedure including specific risks related to bleeding, infection, adjacent organ injury, wound infection, phlebitis, transfusion, possibility of completing the surgery by open technique all discussed, which she understands and accepts.  The need for continued followup cytology at least initially was reviewed with her also.  ALLERGIES:  HYDROCODONE.  CURRENT MEDICATIONS:  None.  OBSTETRICAL HISTORY:  Three vaginal deliveries.  REVIEW OF SYSTEMS:  Significant for history of abnormal Pap, headache.  FAMILY HISTORY:  Positive for UTI, arthritis, diabetes and unspecified cancer.  SOCIAL HISTORY:  Denies alcohol, tobacco, or drug use.  She is single.  PHYSICAL EXAMINATION:  VITAL SIGNS:  Temp 98.2, blood pressure 130/78. HEENT:  Unremarkable. NECK:  Supple without masses. LUNGS:  Clear. CARDIOVASCULAR:  Regular rate and rhythm.  No murmurs, rubs, gallops. BREASTS:  Without masses. ABDOMEN:  Soft, flat, nontender. PELVIC:  Vulva, vagina, cervix unremarkable.  Uterus was mid positioned, upper limit of normal sized, mobile.  Adnexa  unremarkable. EXTREMITIES:  Unremarkable. NEUROLOGIC:  Unremarkable.  IMPRESSION: 1. Cervical dysplasia, moderate or higher degree. 2. Pelvic pain, dysmenorrhea, leiomyoma and adenomyosis noted on     ultrasound measurements.  PLAN:  LAVH and bilateral salpingectomy.  Procedure and risks discussed as above.     Karsen Fellows M. Matthew Saras, M.D.     [facility]/MEDQ  D:  11/19/2016  T:  11/19/2016  Job:  [id number]

## 2016-12-01 NOTE — Patient Instructions (Addendum)
Alecea Zappone  12/01/2016      Your procedure is scheduled on 12-11-16  Report to [facility]  at  6A.M.  Call this number if you have problems the morning of surgery: [facility].   Remember:  Do not eat food or drink liquids after midnight.  Take these medicines the morning of surgery with A SIP OF WATER: ranitidine(zantac) , linzess    Do not wear jewelry, make-up or nail polish.  Do not wear lotions, powders, or perfumes, or deoderant.  Do not shave 48 hours prior to surgery.  Men may shave face and neck.  Do not bring valuables to the hospital.  [facility] is not responsible for any belongings or valuables.  Contacts, dentures or bridgework may not be worn into surgery.  Leave your suitcase in the car.  After surgery it may be brought to your room.  For patients admitted to the hospital, discharge time will be determined by your treatment team.  Patients discharged the day of surgery will not be allowed to drive home.   Special instructions:  N/A  Please read over the following fact sheets that you were given:     [facility] - Preparing for Surgery Before surgery, you can play an important role.  Because skin is not sterile, your skin needs to be as free of germs as possible.  You can reduce the number of germs on your skin by washing with CHG (chlorahexidine gluconate) soap before surgery.  CHG is an antiseptic cleaner which kills germs and bonds with the skin to continue killing germs even after washing. Please DO NOT use if you have an allergy to CHG or antibacterial soaps.  If your skin becomes reddened/irritated stop using the CHG and inform your nurse when you arrive at Short Stay. Do not shave (including legs and underarms) for at least 48 hours prior to the first CHG shower.  You may shave your face/neck. Please follow these instructions carefully:  1.   Shower with CHG Soap the night before surgery and the  morning of Surgery.  2.  If you choose to wash your hair, wash your hair first as usual with your  normal  shampoo.  3.  After you shampoo, rinse your hair and body thoroughly to remove the  shampoo.                           4.  Use CHG as you would any other liquid soap.  You can apply chg directly  to the skin and wash                       Gently with a scrungie or clean washcloth.  5.  Apply the CHG Soap to your body ONLY FROM THE NECK DOWN.   Do not use on face/ open                           Wound or open sores. Avoid contact with eyes, ears mouth and genitals (private parts).                       Wash face,  Genitals (private parts) with your normal soap.  6.  Wash thoroughly, paying special attention to the area where your surgery  will be performed.  7.  Thoroughly rinse your body with warm water from the neck down.  8.  DO NOT shower/wash with your normal soap after using and rinsing off  the CHG Soap.                9.  Pat yourself dry with a clean towel.            10.  Wear clean pajamas.            11.  Place clean sheets on your bed the night of your first shower and do not  sleep with pets. Day of Surgery : Do not apply any lotions/deodorants the morning of surgery.  Please wear clean clothes to the hospital/surgery center.  FAILURE TO FOLLOW THESE INSTRUCTIONS MAY RESULT IN THE CANCELLATION OF YOUR SURGERY PATIENT SIGNATURE_________________________________  NURSE SIGNATURE__________________________________  ________________________________________________________________________

## 2016-12-02 ENCOUNTER — Encounter (INDEPENDENT_AMBULATORY_CARE_PROVIDER_SITE_OTHER): Payer: Self-pay

## 2016-12-02 ENCOUNTER — Encounter (HOSPITAL_COMMUNITY)
Admission: RE | Admit: 2016-12-02 | Discharge: 2016-12-02 | Disposition: A | Discharge status: Home / Self Care | Payer: BLUE CROSS/BLUE SHIELD | Source: Ambulatory Visit | Attending: Obstetrics and Gynecology | Admitting: Obstetrics and Gynecology

## 2016-12-02 ENCOUNTER — Encounter (HOSPITAL_COMMUNITY): Payer: Self-pay

## 2016-12-02 DIAGNOSIS — Z01812 Encounter for preprocedural laboratory examination: Secondary | ICD-10-CM | POA: Insufficient documentation

## 2016-12-02 LAB — CBC
HCT: 41 % (ref 36.0–46.0)
HEMOGLOBIN: 13.6 g/dL (ref 12.0–15.0)
MCH: 28.5 pg (ref 26.0–34.0)
MCHC: 33.2 g/dL (ref 30.0–36.0)
MCV: 86 fL (ref 78.0–100.0)
PLATELETS: 324 10*3/uL (ref 150–400)
RBC: 4.77 MIL/uL (ref 3.87–5.11)
RDW: 13.5 % (ref 11.5–15.5)
WBC: 7.5 10*3/uL (ref 4.0–10.5)

## 2016-12-02 LAB — ABO/RH: ABO/RH(D): O POS

## 2016-12-02 LAB — PREGNANCY, URINE: PREG TEST UR: NEGATIVE

## 2016-12-09 NOTE — Progress Notes (Signed)
CALLED AND SPOKE W/ PT VIA PHONE ABOUT NEW ARRIVAL TIME DOS.  PT VERBALIZED UNDERSTANDING TO ARRIVE AT 0530, NOT 0600, AT [place].

## 2016-12-11 ENCOUNTER — Encounter (HOSPITAL_BASED_OUTPATIENT_CLINIC_OR_DEPARTMENT_OTHER)
Admission: RE | Disposition: A | Discharge status: Home / Self Care | Payer: Self-pay | Source: Ambulatory Visit | Attending: Obstetrics and Gynecology

## 2016-12-11 ENCOUNTER — Ambulatory Visit (HOSPITAL_BASED_OUTPATIENT_CLINIC_OR_DEPARTMENT_OTHER): Payer: BLUE CROSS/BLUE SHIELD | Admitting: Anesthesiology

## 2016-12-11 ENCOUNTER — Encounter (HOSPITAL_BASED_OUTPATIENT_CLINIC_OR_DEPARTMENT_OTHER): Payer: Self-pay | Admitting: *Deleted

## 2016-12-11 ENCOUNTER — Observation Stay (HOSPITAL_BASED_OUTPATIENT_CLINIC_OR_DEPARTMENT_OTHER)
Admission: RE | Admit: 2016-12-11 | Discharge: 2016-12-12 | Disposition: A | Discharge status: Home / Self Care | Payer: BLUE CROSS/BLUE SHIELD | Source: Ambulatory Visit | Attending: Obstetrics and Gynecology | Admitting: Obstetrics and Gynecology

## 2016-12-11 DIAGNOSIS — D259 Leiomyoma of uterus, unspecified: Secondary | ICD-10-CM | POA: Diagnosis not present

## 2016-12-11 DIAGNOSIS — N879 Dysplasia of cervix uteri, unspecified: Secondary | ICD-10-CM | POA: Diagnosis present

## 2016-12-11 DIAGNOSIS — N920 Excessive and frequent menstruation with regular cycle: Secondary | ICD-10-CM | POA: Diagnosis present

## 2016-12-11 DIAGNOSIS — D060 Carcinoma in situ of endocervix: Secondary | ICD-10-CM | POA: Diagnosis not present

## 2016-12-11 DIAGNOSIS — Z809 Family history of malignant neoplasm, unspecified: Secondary | ICD-10-CM | POA: Diagnosis not present

## 2016-12-11 DIAGNOSIS — N946 Dysmenorrhea, unspecified: Secondary | ICD-10-CM | POA: Diagnosis not present

## 2016-12-11 DIAGNOSIS — Z8261 Family history of arthritis: Secondary | ICD-10-CM | POA: Insufficient documentation

## 2016-12-11 DIAGNOSIS — Z833 Family history of diabetes mellitus: Secondary | ICD-10-CM | POA: Insufficient documentation

## 2016-12-11 DIAGNOSIS — N800 Endometriosis of uterus: Secondary | ICD-10-CM | POA: Diagnosis not present

## 2016-12-11 HISTORY — PX: LAPAROSCOPIC VAGINAL HYSTERECTOMY WITH SALPINGECTOMY: SHX6680

## 2016-12-11 LAB — TYPE AND SCREEN
ABO/RH(D): O POS
ANTIBODY SCREEN: NEGATIVE

## 2016-12-11 SURGERY — HYSTERECTOMY, VAGINAL, LAPAROSCOPY-ASSISTED, WITH SALPINGECTOMY
Anesthesia: General | Site: Vagina | Laterality: Bilateral

## 2016-12-11 MED ORDER — FENTANYL CITRATE (PF) 250 MCG/5ML IJ SOLN
INTRAMUSCULAR | Status: AC
  Filled 2016-12-11: qty 5

## 2016-12-11 MED ORDER — DEXAMETHASONE SODIUM PHOSPHATE 10 MG/ML IJ SOLN
INTRAMUSCULAR | Status: DC | PRN
  Administered 2016-12-11: 10 mg via INTRAVENOUS

## 2016-12-11 MED ORDER — ONDANSETRON HCL 4 MG/2ML IJ SOLN
INTRAMUSCULAR | Status: DC | PRN
  Administered 2016-12-11: 4 mg via INTRAVENOUS

## 2016-12-11 MED ORDER — FAMOTIDINE 20 MG PO TABS
ORAL_TABLET | ORAL | Status: AC
  Filled 2016-12-11: qty 1

## 2016-12-11 MED ORDER — KETOROLAC TROMETHAMINE 30 MG/ML IJ SOLN
INTRAMUSCULAR | Status: DC | PRN
  Administered 2016-12-11: 30 mg via INTRAVENOUS

## 2016-12-11 MED ORDER — LACTATED RINGERS IV SOLN
INTRAVENOUS | Status: DC
  Administered 2016-12-11 (×2): via INTRAVENOUS
  Administered 2016-12-11: 1000 mL via INTRAVENOUS
  Filled 2016-12-11: qty 1000

## 2016-12-11 MED ORDER — SUGAMMADEX SODIUM 200 MG/2ML IV SOLN
INTRAVENOUS | Status: AC
  Filled 2016-12-11: qty 2

## 2016-12-11 MED ORDER — SUGAMMADEX SODIUM 200 MG/2ML IV SOLN
INTRAVENOUS | Status: DC | PRN
  Administered 2016-12-11: 125 mg via INTRAVENOUS

## 2016-12-11 MED ORDER — LACTATED RINGERS IV SOLN
INTRAVENOUS | Status: DC
  Filled 2016-12-11: qty 1000

## 2016-12-11 MED ORDER — IBUPROFEN 800 MG PO TABS
800.0000 mg | ORAL_TABLET | Freq: Three times a day (TID) | ORAL | Status: DC | PRN
  Filled 2016-12-11: qty 1

## 2016-12-11 MED ORDER — ACETAMINOPHEN 10 MG/ML IV SOLN
INTRAVENOUS | Status: AC
  Filled 2016-12-11: qty 100

## 2016-12-11 MED ORDER — ONDANSETRON HCL 4 MG PO TABS
4.0000 mg | ORAL_TABLET | Freq: Four times a day (QID) | ORAL | Status: DC | PRN
  Filled 2016-12-11: qty 1

## 2016-12-11 MED ORDER — KETOROLAC TROMETHAMINE 30 MG/ML IJ SOLN
30.0000 mg | Freq: Four times a day (QID) | INTRAMUSCULAR | Status: DC
  Administered 2016-12-11 – 2016-12-12 (×3): 30 mg via INTRAVENOUS
  Filled 2016-12-11: qty 1

## 2016-12-11 MED ORDER — METOCLOPRAMIDE HCL 5 MG/ML IJ SOLN
INTRAMUSCULAR | Status: AC
  Filled 2016-12-11: qty 2

## 2016-12-11 MED ORDER — SODIUM CHLORIDE 0.9 % IR SOLN
Status: DC | PRN
  Administered 2016-12-11: 3000 mL

## 2016-12-11 MED ORDER — BUTORPHANOL TARTRATE 2 MG/ML IJ SOLN
INTRAMUSCULAR | Status: AC
  Filled 2016-12-11: qty 1

## 2016-12-11 MED ORDER — KETOROLAC TROMETHAMINE 30 MG/ML IJ SOLN
INTRAMUSCULAR | Status: AC
  Filled 2016-12-11: qty 1

## 2016-12-11 MED ORDER — BUTORPHANOL TARTRATE 1 MG/ML IJ SOLN
1.0000 mg | INTRAMUSCULAR | Status: DC | PRN
  Administered 2016-12-11: 1 mg via INTRAVENOUS
  Filled 2016-12-11: qty 2

## 2016-12-11 MED ORDER — FENTANYL CITRATE (PF) 100 MCG/2ML IJ SOLN
INTRAMUSCULAR | Status: DC | PRN
  Administered 2016-12-11 (×4): 50 ug via INTRAVENOUS
  Administered 2016-12-11: 100 ug via INTRAVENOUS

## 2016-12-11 MED ORDER — PROPOFOL 10 MG/ML IV BOLUS
INTRAVENOUS | Status: AC
  Filled 2016-12-11: qty 40

## 2016-12-11 MED ORDER — ACETAMINOPHEN 10 MG/ML IV SOLN
INTRAVENOUS | Status: DC | PRN
  Administered 2016-12-11: 1000 mg via INTRAVENOUS

## 2016-12-11 MED ORDER — FENTANYL CITRATE (PF) 100 MCG/2ML IJ SOLN
INTRAMUSCULAR | Status: AC
  Filled 2016-12-11: qty 2

## 2016-12-11 MED ORDER — OXYCODONE-ACETAMINOPHEN 5-325 MG PO TABS
1.0000 | ORAL_TABLET | ORAL | Status: DC | PRN
  Administered 2016-12-12 (×2): 1 via ORAL
  Filled 2016-12-11: qty 2

## 2016-12-11 MED ORDER — BUPIVACAINE HCL (PF) 0.25 % IJ SOLN
INTRAMUSCULAR | Status: DC | PRN
  Administered 2016-12-11: 6 mL

## 2016-12-11 MED ORDER — KETOROLAC TROMETHAMINE 30 MG/ML IJ SOLN
30.0000 mg | Freq: Once | INTRAMUSCULAR | Status: DC
  Filled 2016-12-11: qty 1

## 2016-12-11 MED ORDER — SODIUM CHLORIDE 0.9 % IV SOLN
8.0000 mg | Freq: Once | INTRAVENOUS | Status: AC
  Administered 2016-12-11: 8 mg via INTRAVENOUS
  Filled 2016-12-11 (×2): qty 4

## 2016-12-11 MED ORDER — SODIUM CHLORIDE 0.9 % IV SOLN
8.0000 mg | Freq: Three times a day (TID) | INTRAVENOUS | Status: DC | PRN
  Filled 2016-12-11: qty 4

## 2016-12-11 MED ORDER — PROPOFOL 10 MG/ML IV BOLUS
INTRAVENOUS | Status: DC | PRN
  Administered 2016-12-11: 150 mg via INTRAVENOUS

## 2016-12-11 MED ORDER — MEPERIDINE HCL 25 MG/ML IJ SOLN
6.2500 mg | INTRAMUSCULAR | Status: DC | PRN
  Filled 2016-12-11: qty 1

## 2016-12-11 MED ORDER — MIDAZOLAM HCL 2 MG/2ML IJ SOLN
INTRAMUSCULAR | Status: AC
  Filled 2016-12-11: qty 2

## 2016-12-11 MED ORDER — ROCURONIUM BROMIDE 50 MG/5ML IV SOSY
PREFILLED_SYRINGE | INTRAVENOUS | Status: AC
  Filled 2016-12-11: qty 5

## 2016-12-11 MED ORDER — DEXTROSE IN LACTATED RINGERS 5 % IV SOLN
INTRAVENOUS | Status: DC
  Administered 2016-12-11 – 2016-12-12 (×2): via INTRAVENOUS
  Filled 2016-12-11 (×3): qty 1000

## 2016-12-11 MED ORDER — DEXTROSE IN LACTATED RINGERS 5 % IV SOLN
INTRAVENOUS | Status: DC
  Filled 2016-12-11: qty 1000

## 2016-12-11 MED ORDER — DEXTROSE 5 % IV SOLN
2.0000 g | INTRAVENOUS | Status: AC
  Administered 2016-12-11: 2 g via INTRAVENOUS
  Filled 2016-12-11: qty 2

## 2016-12-11 MED ORDER — ONDANSETRON HCL 4 MG/2ML IJ SOLN
INTRAMUSCULAR | Status: AC
  Filled 2016-12-11: qty 2

## 2016-12-11 MED ORDER — KETOROLAC TROMETHAMINE 30 MG/ML IJ SOLN
30.0000 mg | Freq: Four times a day (QID) | INTRAMUSCULAR | Status: DC
  Filled 2016-12-11: qty 1

## 2016-12-11 MED ORDER — ONDANSETRON HCL 4 MG/2ML IJ SOLN
4.0000 mg | Freq: Four times a day (QID) | INTRAMUSCULAR | Status: DC | PRN
  Administered 2016-12-11 – 2016-12-12 (×2): 4 mg via INTRAVENOUS
  Filled 2016-12-11: qty 2

## 2016-12-11 MED ORDER — METOCLOPRAMIDE HCL 5 MG/ML IJ SOLN
10.0000 mg | Freq: Once | INTRAMUSCULAR | Status: AC | PRN
  Administered 2016-12-11: 10 mg via INTRAVENOUS
  Filled 2016-12-11: qty 2

## 2016-12-11 MED ORDER — LIDOCAINE 2% (20 MG/ML) 5 ML SYRINGE
INTRAMUSCULAR | Status: AC
  Filled 2016-12-11: qty 5

## 2016-12-11 MED ORDER — METOCLOPRAMIDE HCL 5 MG/ML IJ SOLN
INTRAMUSCULAR | Status: DC | PRN
  Administered 2016-12-11 (×2): 5 mg via INTRAVENOUS

## 2016-12-11 MED ORDER — FENTANYL CITRATE (PF) 100 MCG/2ML IJ SOLN
25.0000 ug | INTRAMUSCULAR | Status: DC | PRN
  Administered 2016-12-11 (×4): 25 ug via INTRAVENOUS
  Filled 2016-12-11: qty 1

## 2016-12-11 MED ORDER — LIDOCAINE 2% (20 MG/ML) 5 ML SYRINGE
INTRAMUSCULAR | Status: DC | PRN
  Administered 2016-12-11: 100 mg via INTRAVENOUS

## 2016-12-11 MED ORDER — FAMOTIDINE 20 MG PO TABS
20.0000 mg | ORAL_TABLET | Freq: Two times a day (BID) | ORAL | Status: DC
  Administered 2016-12-11 – 2016-12-12 (×2): 20 mg via ORAL
  Filled 2016-12-11 (×2): qty 1

## 2016-12-11 MED ORDER — KETOROLAC TROMETHAMINE 30 MG/ML IJ SOLN
INTRAMUSCULAR | Status: AC
Start: 2016-12-11 — End: 2016-12-11
  Filled 2016-12-11: qty 1

## 2016-12-11 MED ORDER — ROCURONIUM BROMIDE 10 MG/ML (PF) SYRINGE
PREFILLED_SYRINGE | INTRAVENOUS | Status: DC | PRN
  Administered 2016-12-11: 10 mg via INTRAVENOUS
  Administered 2016-12-11: 40 mg via INTRAVENOUS

## 2016-12-11 MED ORDER — MIDAZOLAM HCL 2 MG/2ML IJ SOLN
INTRAMUSCULAR | Status: DC | PRN
  Administered 2016-12-11: 2 mg via INTRAVENOUS

## 2016-12-11 MED ORDER — GABAPENTIN 100 MG PO CAPS
200.0000 mg | ORAL_CAPSULE | Freq: Two times a day (BID) | ORAL | Status: DC
  Filled 2016-12-11: qty 2

## 2016-12-11 MED ORDER — DEXAMETHASONE SODIUM PHOSPHATE 10 MG/ML IJ SOLN
INTRAMUSCULAR | Status: AC
  Filled 2016-12-11: qty 1

## 2016-12-11 MED ORDER — ARTIFICIAL TEARS OPHTHALMIC OINT
TOPICAL_OINTMENT | OPHTHALMIC | Status: AC
  Filled 2016-12-11: qty 3.5

## 2016-12-11 MED ORDER — MENTHOL 3 MG MT LOZG
1.0000 | LOZENGE | OROMUCOSAL | Status: DC | PRN
  Filled 2016-12-11: qty 9

## 2016-12-11 MED ORDER — CEFOTETAN DISODIUM-DEXTROSE 2-2.08 GM-% IV SOLR
INTRAVENOUS | Status: AC
  Filled 2016-12-11: qty 50

## 2016-12-11 SURGICAL SUPPLY — 49 items
APPLICATOR ARISTA FLEXITIP XL (MISCELLANEOUS) ×3 IMPLANT
CABLE HIGH FREQUENCY MONO STRZ (ELECTRODE) IMPLANT
CATH ROBINSON RED A/P 16FR (CATHETERS) ×3 IMPLANT
CLOTH BEACON ORANGE TIMEOUT ST (SAFETY) ×3 IMPLANT
CONT PATH 16OZ SNAP LID 3702 (MISCELLANEOUS) IMPLANT
COVER BACK TABLE 60X90IN (DRAPES) ×3 IMPLANT
DECANTER SPIKE VIAL GLASS SM (MISCELLANEOUS) IMPLANT
DERMABOND ADVANCED (GAUZE/BANDAGES/DRESSINGS) ×2
DERMABOND ADVANCED .7 DNX12 (GAUZE/BANDAGES/DRESSINGS) ×1 IMPLANT
DRSG OPSITE POSTOP 3X4 (GAUZE/BANDAGES/DRESSINGS) ×3 IMPLANT
DRSG TEGADERM 4X4.75 (GAUZE/BANDAGES/DRESSINGS) ×3 IMPLANT
DURAPREP 26ML APPLICATOR (WOUND CARE) ×3 IMPLANT
ELECT REM PT RETURN 9FT ADLT (ELECTROSURGICAL) ×3
ELECTRODE REM PT RTRN 9FT ADLT (ELECTROSURGICAL) ×1 IMPLANT
GAUZE SPONGE 4X4 16PLY XRAY LF (GAUZE/BANDAGES/DRESSINGS) ×3 IMPLANT
GLOVE BIO SURGEON STRL SZ7 (GLOVE) ×6 IMPLANT
GLOVE BIOGEL PI IND STRL 6.5 (GLOVE) ×1 IMPLANT
GLOVE BIOGEL PI IND STRL 7.0 (GLOVE) ×2 IMPLANT
GLOVE BIOGEL PI INDICATOR 6.5 (GLOVE) ×2
GLOVE BIOGEL PI INDICATOR 7.0 (GLOVE) ×4
HEMOSTAT ARISTA ABSORB 3G PWDR (MISCELLANEOUS) ×3 IMPLANT
HOLDER FOLEY CATH W/STRAP (MISCELLANEOUS) ×3 IMPLANT
IV NS IRRIG 3000ML ARTHROMATIC (IV SOLUTION) ×3 IMPLANT
LEGGING LITHOTOMY PAIR STRL (DRAPES) ×3 IMPLANT
LIGASURE IMPACT 36 18CM CVD LR (INSTRUMENTS) ×3 IMPLANT
NEEDLE INSUFFLATION 120MM (ENDOMECHANICALS) ×3 IMPLANT
NS IRRIG 1000ML POUR BTL (IV SOLUTION) ×3 IMPLANT
PACK LAVH (CUSTOM PROCEDURE TRAY) ×3 IMPLANT
PACK ROBOTIC GOWN (GOWN DISPOSABLE) IMPLANT
PACK TRENDGUARD 450 HYBRID PRO (MISCELLANEOUS) ×1 IMPLANT
PACK TRENDGUARD 600 HYBRD PROC (MISCELLANEOUS) IMPLANT
PROTECTOR NERVE ULNAR (MISCELLANEOUS) ×6 IMPLANT
SEALER TISSUE G2 CVD JAW 45CM (ENDOMECHANICALS) ×3 IMPLANT
SET IRRIG TUBING LAPAROSCOPIC (IRRIGATION / IRRIGATOR) ×3 IMPLANT
SLEEVE XCEL OPT CAN 5 100 (ENDOMECHANICALS) ×3 IMPLANT
SUT MON AB 2-0 CT1 36 (SUTURE) ×6 IMPLANT
SUT VIC AB 0 CT1 18XCR BRD8 (SUTURE) ×3 IMPLANT
SUT VIC AB 0 CT1 36 (SUTURE) ×3 IMPLANT
SUT VIC AB 0 CT1 8-18 (SUTURE) ×6
SUT VICRYL 0 TIES 12 18 (SUTURE) ×3 IMPLANT
SUT VICRYL 4-0 PS2 18IN ABS (SUTURE) ×3 IMPLANT
TOWEL OR 17X24 6PK STRL BLUE (TOWEL DISPOSABLE) ×12 IMPLANT
TRAY FOLEY CATH SILVER 14FR (SET/KITS/TRAYS/PACK) ×3 IMPLANT
TRENDGUARD 450 HYBRID PRO PACK (MISCELLANEOUS) ×3
TRENDGUARD 600 HYBRID PROC PK (MISCELLANEOUS)
TROCAR OPTI TIP 5M 100M (ENDOMECHANICALS) ×3 IMPLANT
TROCAR XCEL DIL TIP R 11M (ENDOMECHANICALS) ×3 IMPLANT
TUBING INSUF HEATED (TUBING) ×3 IMPLANT
WARMER LAPAROSCOPE (MISCELLANEOUS) ×3 IMPLANT

## 2016-12-11 NOTE — Progress Notes (Signed)
Received into [facility] rm # 3.  Pt transferred to bed from stretcher w minimal assistance.  Oriented to room. Call bell within reach. Spouse at bedside.  Oriented to guidelines of unit. Spouse verbalized his understanding.

## 2016-12-11 NOTE — Transfer of Care (Signed)
Last Vitals:  Vitals:   12/11/16 0559  BP: (!) 124/96  Pulse: 98  Resp: 14  Temp: 36.9 C  SpO2: 99%    Last Pain:  Vitals:   12/11/16 0559  TempSrc: Oral      Patients Stated Pain Goal: 8 (12/11/16 0606)  Immediate Anesthesia Transfer of Care Note  Patient: Tracey Coleman  Procedure(s) Performed: Procedure(s) (LRB): LAPAROSCOPIC ASSISTED VAGINAL HYSTERECTOMY WITH SALPINGECTOMY (Bilateral)  Patient Location: PACU  Anesthesia Type: General  Level of Consciousness: awake, alert  and oriented  Airway & Oxygen Therapy: Patient Spontanous Breathing and Patient connected to nasal cannula oxygen  Post-op Assessment: Report given to PACU RN and Post -op Vital signs reviewed and stable  Post vital signs: Reviewed and stable  Complications: No apparent anesthesia complications

## 2016-12-11 NOTE — Progress Notes (Signed)
Pt. Continues to work on post op goals. Walked Catering manager. Post walk, pt experienced large vomitting episode. Dr. Matthew Saras called. Verbal order for PRN zofran 8 mg. Pt. Recvd. Dose with relief. Pain relief with toradol. Will continue to monitor. VSS & UOP adequate.

## 2016-12-11 NOTE — Anesthesia Postprocedure Evaluation (Signed)
Anesthesia Post Note  Patient: Tracey Coleman  Procedure(s) Performed: Procedure(s) (LRB): LAPAROSCOPIC ASSISTED VAGINAL HYSTERECTOMY WITH SALPINGECTOMY (Bilateral)     Patient location during evaluation: PACU Anesthesia Type: General Level of consciousness: awake and alert Pain management: pain level controlled Vital Signs Assessment: post-procedure vital signs reviewed and stable Respiratory status: spontaneous breathing, nonlabored ventilation, respiratory function stable and patient connected to nasal cannula oxygen Cardiovascular status: blood pressure returned to baseline and stable Postop Assessment: no apparent nausea or vomiting Anesthetic complications: no    Last Vitals:  Vitals:   12/11/16 1000 12/11/16 1002  BP: 111/82   Pulse: 81 87  Resp: 18 (!) 21  Temp:    SpO2: 99% 97%    Last Pain:  Vitals:   12/11/16 1015  TempSrc:   PainSc: Asleep                 Montez Hageman

## 2016-12-11 NOTE — Progress Notes (Signed)
Assisted to sit at bedside.  Slow moving. Able to dangle x 10 minutes.  Vomited 50cc's clear liquids in basin.  Cool cloth to forehead and neck w relief. Assisted back to reclining position.   Will continue to monitor.  Will try to ambulate later.

## 2016-12-11 NOTE — Anesthesia Preprocedure Evaluation (Signed)
Anesthesia Evaluation  Patient identified by MRN, date of birth, ID band Patient awake    Reviewed: Allergy & Precautions, NPO status , Patient's Chart, lab work & pertinent test results  Airway Mallampati: II  TM Distance: >3 FB Neck ROM: Full    Dental no notable dental hx.    Pulmonary neg pulmonary ROS,    Pulmonary exam normal breath sounds clear to auscultation       Cardiovascular negative cardio ROS Normal cardiovascular exam Rhythm:Regular Rate:Normal     Neuro/Psych negative neurological ROS  negative psych ROS   GI/Hepatic negative GI ROS, Neg liver ROS,   Endo/Other  negative endocrine ROS  Renal/GU negative Renal ROS  negative genitourinary   Musculoskeletal negative musculoskeletal ROS (+)   Abdominal   Peds negative pediatric ROS (+)  Hematology negative hematology ROS (+)   Anesthesia Other Findings   Reproductive/Obstetrics negative OB ROS                             Anesthesia Physical Anesthesia Plan  ASA: I  Anesthesia Plan: General   Post-op Pain Management:    Induction: Intravenous  PONV Risk Score and Plan: 4 or greater and Ondansetron, Dexamethasone, Midazolam, Scopolamine patch - Pre-op and Treatment may vary due to age or medical condition  Airway Management Planned: Oral ETT  Additional Equipment:   Intra-op Plan:   Post-operative Plan: Extubation in OR  Informed Consent: I have reviewed the patients History and Physical, chart, labs and discussed the procedure including the risks, benefits and alternatives for the proposed anesthesia with the patient or authorized representative who has indicated his/her understanding and acceptance.   Dental advisory given  Plan Discussed with: CRNA  Anesthesia Plan Comments:         Anesthesia Quick Evaluation

## 2016-12-11 NOTE — Anesthesia Procedure Notes (Signed)
Procedure Name: Intubation Date/Time: 12/11/2016 7:29 AM Performed by: Montez Hageman Pre-anesthesia Checklist: Patient identified, Emergency Drugs available, Suction available and Patient being monitored Patient Re-evaluated:Patient Re-evaluated prior to induction Oxygen Delivery Method: Circle system utilized Preoxygenation: Pre-oxygenation with 100% oxygen Induction Type: IV induction Ventilation: Mask ventilation without difficulty Laryngoscope Size: Mac and 3 Grade View: Grade I Tube type: Oral Number of attempts: 1 Airway Equipment and Method: Stylet Placement Confirmation: ETT inserted through vocal cords under direct vision,  positive ETCO2 and breath sounds checked- equal and bilateral Secured at: 20 cm Tube secured with: Tape Dental Injury: Teeth and Oropharynx as per pre-operative assessment

## 2016-12-11 NOTE — Progress Notes (Signed)
The patient was re-examined with no change in status 

## 2016-12-12 ENCOUNTER — Encounter (HOSPITAL_BASED_OUTPATIENT_CLINIC_OR_DEPARTMENT_OTHER): Payer: Self-pay | Admitting: *Deleted

## 2016-12-12 DIAGNOSIS — D060 Carcinoma in situ of endocervix: Secondary | ICD-10-CM | POA: Diagnosis not present

## 2016-12-12 LAB — CBC
HCT: 26.3 % — ABNORMAL LOW (ref 36.0–46.0)
HEMOGLOBIN: 9 g/dL — AB (ref 12.0–15.0)
MCH: 29.2 pg (ref 26.0–34.0)
MCHC: 34.2 g/dL (ref 30.0–36.0)
MCV: 85.4 fL (ref 78.0–100.0)
PLATELETS: 235 10*3/uL (ref 150–400)
RBC: 3.08 MIL/uL — ABNORMAL LOW (ref 3.87–5.11)
RDW: 14.2 % (ref 11.5–15.5)
WBC: 12.3 10*3/uL — ABNORMAL HIGH (ref 4.0–10.5)

## 2016-12-12 MED ORDER — ONDANSETRON HCL 4 MG/2ML IJ SOLN
INTRAMUSCULAR | Status: AC
  Filled 2016-12-12: qty 2

## 2016-12-12 MED ORDER — IBUPROFEN 800 MG PO TABS: 800.0000 mg | ORAL_TABLET | Freq: Three times a day (TID) | ORAL | 1 refills | Status: DC | PRN

## 2016-12-12 MED ORDER — OXYCODONE-ACETAMINOPHEN 5-325 MG PO TABS
ORAL_TABLET | ORAL | Status: AC
  Filled 2016-12-12: qty 1

## 2016-12-12 MED ORDER — OXYCODONE-ACETAMINOPHEN 5-325 MG PO TABS: 1.0000 | ORAL_TABLET | ORAL | 0 refills | Status: DC | PRN

## 2016-12-12 MED ORDER — KETOROLAC TROMETHAMINE 30 MG/ML IJ SOLN
INTRAMUSCULAR | Status: AC
  Filled 2016-12-12: qty 1

## 2016-12-12 MED ORDER — ONDANSETRON 8 MG PO TBDP: 8.0000 mg | ORAL_TABLET | Freq: Three times a day (TID) | ORAL | 0 refills | Status: DC | PRN

## 2016-12-12 MED ORDER — FERROUS SULFATE 325 (65 FE) MG PO TABS: 325.0000 mg | ORAL_TABLET | Freq: Every day | ORAL | 3 refills | Status: DC

## 2016-12-12 NOTE — Discharge Summary (Signed)
Physician Discharge Summary  Patient ID: Tracey Coleman MRN: [id number] DOB/AGE: 40-19-1977 40 y.o.  Admit date: 12/11/2016 Discharge date: 12/12/2016  Admission Diagnoses:RECURRENT CERVICAL DYSPLASIA, MENORRHAGIA, PELVIC PAIN  Discharge Diagnoses: SAME Active Problems:   Cervical dysplasia   Discharged Condition: good  Hospital Course: adm for LAVH + BS, on POD 1, afeb, ambulating , tol PO, abd soft + BS  Consults: None  Significant Diagnostic Studies: labs:  CBC    Component Value Date/Time   WBC 12.3 (H) 12/12/2016 0409   RBC 3.08 (L) 12/12/2016 0409   HGB 9.0 (L) 12/12/2016 0409   HGB 13.7 08/22/2015 1617   HCT 26.3 (L) 12/12/2016 0409   HCT 41.4 08/22/2015 1617   PLT 235 12/12/2016 0409   PLT 296 08/22/2015 1617   MCV 85.4 12/12/2016 0409   MCV 86 08/22/2015 1617   MCH 29.2 12/12/2016 0409   MCHC 34.2 12/12/2016 0409   RDW 14.2 12/12/2016 0409   RDW 14.2 08/22/2015 1617     Treatments: surgery: LAVH, BS  Discharge Exam: Blood pressure (!) 104/57, pulse (!) 107, temperature 98.9 F (37.2 C), temperature source Oral, resp. rate 18, height 5\' 1"  (1.549 m), weight 135 lb (61.2 kg), SpO2 95 %. abd soft + BS, incs C/D  Disposition:    Allergies as of 12/12/2016      Reactions   Codeine Nausea And Vomiting   "anything that has codeine"       Medication List    TAKE these medications   ferrous sulfate 325 (65 FE) MG tablet Commonly known as:  FERROUSUL Take 1 tablet (325 mg total) by mouth daily with breakfast.   ibuprofen 800 MG tablet Commonly known as:  ADVIL,MOTRIN Take 1 tablet (800 mg total) by mouth every 8 (eight) hours as needed (mild pain).   linaclotide 72 MCG capsule Commonly known as:  LINZESS Take 1 capsule (72 mcg total) by mouth daily before breakfast.   ondansetron 8 MG disintegrating tablet Commonly known as:  ZOFRAN ODT Take 1 tablet (8 mg total) by mouth every 8 (eight) hours as needed for nausea.   oxyCODONE-acetaminophen 5-325  MG tablet Commonly known as:  PERCOCET/ROXICET Take 1-2 tablets by mouth every 4 (four) hours as needed for severe pain (moderate to severe pain (when tolerating fluids)).   ranitidine 150 MG tablet Commonly known as:  ZANTAC Take 1 tablet (150 mg total) by mouth 2 (two) times daily.            Discharge Care Instructions        Start     Ordered   12/12/16 0000  oxyCODONE-acetaminophen (PERCOCET/ROXICET) 5-325 MG tablet  Every 4 hours PRN     12/12/16 0733   12/12/16 0000  ibuprofen (ADVIL,MOTRIN) 800 MG tablet  Every 8 hours PRN     12/12/16 0733   12/12/16 0000  ferrous sulfate (FERROUSUL) 325 (65 FE) MG tablet  Daily with breakfast     12/12/16 0733   12/12/16 0000  ondansetron (ZOFRAN ODT) 8 MG disintegrating tablet  Every 8 hours PRN     12/12/16 9702     Follow-up Information    Molli Posey, MD. Schedule an appointment as soon as possible for a visit in 1 week(s).   Specialty:  Obstetrics and Gynecology Contact information: [place] [address] [phone]           Signed: Margarette Asal 12/12/2016, 7:34 AM

## 2016-12-12 NOTE — Op Note (Signed)
Tracey Coleman, ORD NO.:  [id number]  MEDICAL RECORD NO.:  [id number]  LOCATION:                                 FACILITY:  PHYSICIAN:  Tracey Coleman, M.D.    DATE OF BIRTH:  DATE OF PROCEDURE:  12/11/2016 DATE OF DISCHARGE:                              OPERATIVE REPORT   PREOPERATIVE DIAGNOSES: 1. Menorrhagia. 2. Dysmenorrhea. 3. Leiomyoma. 4. Adenomyosis. 5. Moderate cervical dysplasia.  POSTOPERATIVE DIAGNOSES: 1. Menorrhagia. 2. Dysmenorrhea. 3. Leiomyoma. 4. Adenomyosis. 5. Moderate cervical dysplasia.  PROCEDURE:  LAVH, bilateral salpingectomy.  SURGEON:  Tracey Coleman, M.D.  Tracey Coleman.  ANESTHESIA:  General endotracheal.  COMPLICATIONS:  None.  DRAINS:  Foley catheter.  BLOOD LOSS:  200 mL.  PROCEDURE AND FINDINGS:  The patient was taken to the operating room after an adequate level of general anesthesia was obtained with the patient's legs in stirrups.  The abdomen, perineum, and vagina were prepped and draped.  The bladder was drained.  A Hulka tenaculum was positioned.  This was done after appropriate time-outs were taken. Attention was directed to the abdomen where the subumbilical area was infiltrated with 0.25% Marcaine plain.  A small incision was made and a Veress needle was introduced without difficulty.  After 2.5 L pneumoperitoneum was then created, laparoscopic trocar and sleeve were then introduced without difficulty.  There was no evidence of any bleeding or trauma.  Three fingerbreadths above the symphysis in midline, a 5 mm trocar was inserted under direct visualization, and the patient was then placed in Trendelenburg and the pelvic findings are as follows.  The uterus itself had symmetrically enlarged at the fundus, 8 weeks' size.  Adnexa unremarkable.  The appendix and upper abdomen were normal. Atraumatic grasper was then used to grasp the right tube at the distal end.  The EnSeal device was  then used to coagulate and divide the mesosalpinx, dissecting the tube free up to the origin.  The utero- ovarian pedicle was then coagulated and divided down to and including the round ligament with the EnSeal.  The exact same was repeated on the opposite side, thus both ovaries were conserved.  Once this was completed, the vaginal portion of the procedure was started.  Legs were extended.  Weighted speculum was positioned.  Cervix grasped with tenaculum.  The cervical vaginal mucosa was incised with the Bovie. Posterior culdotomy performed without difficulty, the bladder was advanced superiorly with sharp and blunt dissection until the anterior peritoneal reflection could be identified and entered.  Retractor was then used to gently hold the bladder out of the field in sequential manner.  The uterosacral ligament, cardinal ligament, upper uterine vasculature pedicle and upper broad ligament pedicles were clamped, divided, and suture ligated with 0 Vicryl.  The fundus of the uterus was then delivered posteriorly.  Remaining pedicles were clamped, divided, and suture ligated plus free tie.  The posterior vaginal cuff was then closed with a running locked 2-0 Vicryl suture from 3 o'clock to 9 o'clock.  McCall's culdoplasty suture was then placed from the left uterosacral ligament, picking up posterior peritoneum across to the opposite side, which was tied down for  extra posterior support.  Prior to closure, sponge, needle, instrument counts reported as correct x2. The vaginal mucosa was then closed right to left with interrupted 2-0 Vicryl sutures.  Repeat laparoscopy was then carried out with the Tolono. After irrigation and aspiration, was noted to be hemostatic.  There was minimal oozing at the cuff, Arista was blown across the cuff area to ensure hemostasis.  The pressure was reduced and this was noted to be hemostatic.  Prior to closure, sponge, needle, instrument counts reported as  correct x2.  The upper incision closed with 4-0 Monocryl subcuticular closure.  Dermabond on the lower.  She tolerated this well, went to recovery room in good condition.  A Foley catheter had been placed after the vaginal portion of procedure, draining clear urine.     Tracey Coleman, M.D.   ______________________________ Tracey Coleman, M.D.    Tracey Coleman  D:  12/11/2016  T:  12/11/2016  Job:  [id number]

## 2016-12-12 NOTE — Discharge Instructions (Addendum)
No driving for two weeks. No tub baths for one month.May shower starting today.

## 2016-12-15 ENCOUNTER — Encounter (HOSPITAL_BASED_OUTPATIENT_CLINIC_OR_DEPARTMENT_OTHER): Payer: Self-pay | Admitting: Obstetrics and Gynecology

## 2017-06-08 ENCOUNTER — Ambulatory Visit: Payer: BLUE CROSS/BLUE SHIELD | Admitting: Gastroenterology

## 2017-06-08 ENCOUNTER — Encounter: Payer: Self-pay | Admitting: Gastroenterology

## 2017-06-08 VITALS — BP 100/60 | HR 83 | Ht 61.0 in | Wt 136.6 lb

## 2017-06-08 DIAGNOSIS — R140 Abdominal distension (gaseous): Secondary | ICD-10-CM | POA: Diagnosis not present

## 2017-06-08 DIAGNOSIS — R141 Gas pain: Secondary | ICD-10-CM

## 2017-06-08 DIAGNOSIS — K5900 Constipation, unspecified: Secondary | ICD-10-CM

## 2017-06-08 DIAGNOSIS — R1032 Left lower quadrant pain: Secondary | ICD-10-CM

## 2017-06-08 DIAGNOSIS — R1310 Dysphagia, unspecified: Secondary | ICD-10-CM

## 2017-06-08 DIAGNOSIS — R197 Diarrhea, unspecified: Secondary | ICD-10-CM

## 2017-06-08 MED ORDER — LUBIPROSTONE 8 MCG PO CAPS: 8.0000 ug | ORAL_CAPSULE | Freq: Two times a day (BID) | ORAL | 3 refills | Status: DC

## 2017-06-08 MED ORDER — OMEPRAZOLE 20 MG PO CPDR: 20.0000 mg | DELAYED_RELEASE_CAPSULE | Freq: Every day | ORAL | 3 refills | Status: DC

## 2017-06-08 NOTE — Progress Notes (Signed)
Tracey Coleman    [id number]    11-14-1975  [facility]  Referring Physician: [facility] [place], [place] [address]  Chief complaint:  Gas, bloating  HPI: 40 yr F here with c/o LLQ abdominal pain, excessive bloating, gas and cramping for past 2-3 years She had hysterectomy and bilateral oopherectomy sep 2018. She thought her symptoms would improve but continue to persist. She also has intermittent nausea and excessive belching Appetite is good but sometimes gets too full post prandial even with small meals  Alternating constipation and diarrhea, has a BM almost every other day or daily (2-3 max bowel movements in a day), denies any nocturnal bowel movements or symptoms. No dysphagia, vomiting, melena or blood per rectum . No weight loss. No family history of colon cancer.     Outpatient Encounter Medications as of 06/08/2017  Medication Sig  . [DISCONTINUED] ferrous sulfate (FERROUSUL) 325 (65 FE) MG tablet Take 1 tablet (325 mg total) by mouth daily with breakfast.  . [DISCONTINUED] ibuprofen (ADVIL,MOTRIN) 800 MG tablet Take 1 tablet (800 mg total) by mouth every 8 (eight) hours as needed (mild pain).  . [DISCONTINUED] linaclotide (LINZESS) 72 MCG capsule Take 1 capsule (72 mcg total) by mouth daily before breakfast.  . [DISCONTINUED] ondansetron (ZOFRAN ODT) 8 MG disintegrating tablet Take 1 tablet (8 mg total) by mouth every 8 (eight) hours as needed for nausea.  . [DISCONTINUED] oxyCODONE-acetaminophen (PERCOCET/ROXICET) 5-325 MG tablet Take 1-2 tablets by mouth every 4 (four) hours as needed for severe pain (moderate to severe pain (when tolerating fluids)).  . [DISCONTINUED] ranitidine (ZANTAC) 150 MG tablet Take 1 tablet (150 mg total) by mouth 2 (two) times daily.   Facility-Administered Encounter Medications as of 06/08/2017  Medication  . gadopentetate dimeglumine (MAGNEVIST) injection 13  mL    Allergies as of 06/08/2017 - Review Complete 06/08/2017  Allergen Reaction Noted  . Codeine Nausea And Vomiting 12/02/2016    Past Medical History:  Diagnosis Date  . GERD (gastroesophageal reflux disease)   . Headaches due to old head injury     Past Surgical History:  Procedure Laterality Date  . LAPAROSCOPIC VAGINAL HYSTERECTOMY WITH SALPINGECTOMY Bilateral 12/11/2016   Procedure: LAPAROSCOPIC ASSISTED VAGINAL HYSTERECTOMY WITH SALPINGECTOMY;  Surgeon: Molli Posey, MD;  Location: [facility];  Service: Gynecology;  Laterality: Bilateral;  . PLACEMENT OF BREAST IMPLANTS      Family History  Problem Relation Age of Onset  . Diabetes Mother   . Hypertension Mother   . Cancer Brother   . Seizures Neg Hx   . Migraines Neg Hx     Social History   Socioeconomic History  . Marital status: Single    Spouse name: Not on file  . Number of children: 3  . Years of education: 50  . Highest education level: Not on file  Occupational History  . Occupation: [employer]  Social Needs  . Financial resource strain: Not on file  . Food insecurity:    Worry: Not on file    Inability: Not on file  . Transportation needs:    Medical: Not on file    Non-medical: Not on file  Tobacco Use  . Smoking status: Never Smoker  . Smokeless tobacco: Never Used  Substance and Sexual Activity  . Alcohol use: No    Alcohol/week: 0.0 oz  . Drug use: No  . Sexual  activity: Not on file  Lifestyle  . Physical activity:    Days per week: Not on file    Minutes per session: Not on file  . Stress: Not on file  Relationships  . Social connections:    Talks on phone: Not on file    Gets together: Not on file    Attends religious service: Not on file    Active member of club or organization: Not on file    Attends meetings of clubs or organizations: Not on file    Relationship status: Not on file  . Intimate partner violence:    Fear of current or ex partner: Not on  file    Emotionally abused: Not on file    Physically abused: Not on file    Forced sexual activity: Not on file  Other Topics Concern  . Not on file  Social History Narrative   Lives with parents and kids   Caffeine use: Drink coffee (once per day)      Review of systems: Review of Systems  Constitutional: Negative for fever and chills.  HENT: Negative.   Eyes: Negative for blurred vision.  Respiratory: Negative for cough, shortness of breath and wheezing.   Cardiovascular: Negative for chest pain and palpitations.  Gastrointestinal: as per HPI Genitourinary: Negative for dysuria, urgency, frequency and hematuria.  Musculoskeletal: Negative for myalgias, back pain and joint pain.  Skin: Negative for itching and rash.  Neurological: Negative for dizziness, tremors, focal weakness, seizures and loss of consciousness.  Endo/Heme/Allergies: Positive for seasonal allergies.  Psychiatric/Behavioral: Negative for depression, suicidal ideas and hallucinations.  All other systems reviewed and are negative.   Physical Exam: Vitals:   06/08/17 1338  BP: 100/60  Pulse: 83  SpO2: 99%   Body mass index is 25.81 kg/m. Gen:      No acute distress HEENT:  EOMI, sclera anicteric Neck:     No masses; no thyromegaly Lungs:    Clear to auscultation bilaterally; normal respiratory effort CV:         Regular rate and rhythm; no murmurs Abd:      + bowel sounds; soft, non-tender; no palpable masses, no distension Ext:    No edema; adequate peripheral perfusion Skin:      Warm and dry; no rash Neuro: alert and oriented x 3 Psych: normal mood and affect  Data Reviewed:  Reviewed labs, radiology imaging, old records and pertinent past GI work up  Gastric emptying scan October 08, 2016 Expected location of the stomach in the left upper quadrant.  Ingested meal empties the stomach gradually over the course of the study.  70% emptying at 1 hour.  92% emptying at 2 hours.  Findings  represent normal gastric emptying.  IMPRESSION: Normal gastric emptying study  EGD October 15, 2015 Esophagus normal Diffuse mild gastritis, biopsies negative for H. Pylori Duodenum was normal  Assessment and Plan/Recommendations: 57 yr F with c/o epigastric and LLQ abdominal pain, bloating, excessive belching and dyspepsia symptoms.  EGD unremarkable. 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 All questions answered. The patient agrees to proceed.   Greater than 50% of the  time used for counseling as well as treatment plan and follow-up. She had multiple questions which were answered to her satisfaction  K. Denzil Magnuson , MD [phone] Mon-Fri 8a-5p [phone] after 5p, weekends, holidays  CC: [facility], [facility]*

## 2017-06-08 NOTE — Patient Instructions (Signed)
If you are age 41 or older, your body mass index should be between 23-30. Your Body mass index is 25.81 kg/m. If this is out of the aforementioned range listed, please consider follow up with your Primary Care Provider.  If you are age 50 or younger, your body mass index should be between 19-25. Your Body mass index is 25.81 kg/m. If this is out of the aformentioned range listed, please consider follow up with your Primary Care Provider.   We have sent the following medications to your pharmacy for you to pick up at your convenience: Omeprazole 20mg  daily before breakfast. Amitiza 8mg  twice daily.   Please purchase the following medications over the counter and take as directed: Gas X one capsule three times daily as needed.   You have been scheduled for a colonoscopy. Please follow written instructions given to you at your visit today.  Please pick up your prep supplies at the pharmacy within the next 1-3 days. If you use inhalers (even only as needed), please bring them with you on the day of your procedure. Your physician has requested that you go to www.startemmi.com and enter the access code given to you at your visit today. This web site gives a general overview about your procedure. However, you should still follow specific instructions given to you by our office regarding your preparation for the procedure.  You have been scheduled for an abdominal ultrasound at [facility] (1st floor of hospital) on 3/29 at 9am. Please arrive 15 minutes prior to your appointment for registration. Make certain not to have anything to eat or drink 6 hours prior to your appointment. Should you need to reschedule your appointment, please contact radiology at [phone]. This test typically takes about 30 minutes to perform.  Please record on the food diary and bring it back to the office with you to your next appointment.

## 2017-06-11 ENCOUNTER — Encounter: Payer: Self-pay | Admitting: Gastroenterology

## 2017-06-12 ENCOUNTER — Ambulatory Visit (HOSPITAL_COMMUNITY)
Admission: RE | Admit: 2017-06-12 | Discharge: 2017-06-12 | Disposition: A | Discharge status: Home / Self Care | Payer: BLUE CROSS/BLUE SHIELD | Source: Ambulatory Visit | Attending: Gastroenterology | Admitting: Gastroenterology

## 2017-06-12 DIAGNOSIS — R141 Gas pain: Secondary | ICD-10-CM

## 2017-06-12 DIAGNOSIS — R1032 Left lower quadrant pain: Secondary | ICD-10-CM | POA: Diagnosis present

## 2017-06-12 DIAGNOSIS — R140 Abdominal distension (gaseous): Secondary | ICD-10-CM

## 2017-06-12 DIAGNOSIS — R197 Diarrhea, unspecified: Secondary | ICD-10-CM

## 2017-06-12 DIAGNOSIS — K5900 Constipation, unspecified: Secondary | ICD-10-CM

## 2017-06-19 ENCOUNTER — Telehealth: Payer: Self-pay | Admitting: Gastroenterology

## 2017-06-19 MED ORDER — NA SULFATE-K SULFATE-MG SULF 17.5-3.13-1.6 GM/177ML PO SOLN: 1.0000 | Freq: Once | ORAL | 0 refills | Status: AC

## 2017-06-19 NOTE — Telephone Encounter (Signed)
Prep sent to requested pharmacy

## 2017-06-22 ENCOUNTER — Encounter: Payer: Self-pay | Admitting: Gastroenterology

## 2017-06-22 ENCOUNTER — Other Ambulatory Visit: Payer: Self-pay

## 2017-06-22 ENCOUNTER — Ambulatory Visit (AMBULATORY_SURGERY_CENTER): Payer: BLUE CROSS/BLUE SHIELD | Admitting: Gastroenterology

## 2017-06-22 VITALS — BP 120/83 | HR 81 | Temp 99.3°F | Resp 18 | Ht 61.0 in | Wt 136.0 lb

## 2017-06-22 DIAGNOSIS — R1032 Left lower quadrant pain: Secondary | ICD-10-CM | POA: Diagnosis not present

## 2017-06-22 DIAGNOSIS — R1012 Left upper quadrant pain: Secondary | ICD-10-CM | POA: Diagnosis not present

## 2017-06-22 MED ORDER — SODIUM CHLORIDE 0.9 % IV SOLN: 500.0000 mL | Freq: Once | INTRAVENOUS | Status: AC

## 2017-06-22 NOTE — Op Note (Signed)
[facility] Patient Name: Tracey Coleman Procedure Date: 06/22/2017 2:35 PM MRN: [id number] Endoscopist: Mauri Pole , MD Age: 42 Referring MD:  Date of Birth: 07/31/1975 Gender: Female Account #: [id number] Procedure:                Colonoscopy Indications:              Abdominal pain in the left lower quadrant,                            Abdominal pain in the left upper quadrant,                            constipation alternating with diarrhea Procedure:                Pre-Anesthesia Assessment:                           - Prior to the procedure, a History and Physical                            was performed, and patient medications and                            allergies were reviewed. The patient's tolerance of                            previous anesthesia was also reviewed. The risks                            and benefits of the procedure and the sedation                            options and risks were discussed with the patient.                            All questions were answered, and informed consent                            was obtained. Prior Anticoagulants: The patient has                            taken no previous anticoagulant or antiplatelet                            agents. ASA Grade Assessment: I - A normal, healthy                            patient. After reviewing the risks and benefits,                            the patient was deemed in satisfactory condition to                            undergo the procedure.  After obtaining informed consent, the colonoscope                            was passed under direct vision. Throughout the                            procedure, the patient's blood pressure, pulse, and                            oxygen saturations were monitored continuously. The                            Model PCF-H190DL [id number]) scope was introduced                            through the anus  and advanced to the the cecum,                            identified by appendiceal orifice and ileocecal                            valve. The colonoscopy was performed without                            difficulty. The patient tolerated the procedure                            well. The quality of the bowel preparation was                            excellent. The ileocecal valve, appendiceal                            orifice, and rectum were photographed. Scope In: 2:49:21 PM Scope Out: 2:59:58 PM Scope Withdrawal Time: 0 hours 6 minutes 57 seconds  Total Procedure Duration: 0 hours 10 minutes 37 seconds  Findings:                 The perianal and digital rectal examinations were                            normal.                           The colon (entire examined portion) appeared normal. Complications:            No immediate complications. Estimated Blood Loss:     Estimated blood loss: none. Impression:               - The entire examined colon is normal.                           - No specimens collected. Recommendation:           - Patient has a contact number available for  emergencies. The signs and symptoms of potential                            delayed complications were discussed with the                            patient. Return to normal activities tomorrow.                            Written discharge instructions were provided to the                            patient.                           - Resume previous diet.                           - Continue present medications.                           - Repeat colonoscopy in 10 years for screening                            purposes. Mauri Pole, MD 06/22/2017 3:08:27 PM This report has been signed electronically.

## 2017-06-22 NOTE — Progress Notes (Signed)
Pt. Reports having no change in her medical or surgical history since her pre-visit 06/08/2017.

## 2017-06-22 NOTE — Patient Instructions (Signed)
YOU HAD AN ENDOSCOPIC PROCEDURE TODAY AT THE [facility]:   Refer to the procedure report that was given to you for any specific questions about what was found during the examination.  If the procedure report does not answer your questions, please call your gastroenterologist to clarify.  If you requested that your care partner not be given the details of your procedure findings, then the procedure report has been included in a sealed envelope for you to review at your convenience later.  YOU SHOULD EXPECT: Some feelings of bloating in the abdomen. Passage of more gas than usual.  Walking can help get rid of the air that was put into your GI tract during the procedure and reduce the bloating. If you had a lower endoscopy (such as a colonoscopy or flexible sigmoidoscopy) you may notice spotting of blood in your stool or on the toilet paper. If you underwent a bowel prep for your procedure, you may not have a normal bowel movement for a few days.  Please Note:  You might notice some irritation and congestion in your nose or some drainage.  This is from the oxygen used during your procedure.  There is no need for concern and it should clear up in a day or so.  SYMPTOMS TO REPORT IMMEDIATELY:   Following lower endoscopy (colonoscopy or flexible sigmoidoscopy):  Excessive amounts of blood in the stool  Significant tenderness or worsening of abdominal pains  Swelling of the abdomen that is new, acute  Fever of 100F or higher  For urgent or emergent issues, a gastroenterologist can be reached at any hour by calling [phone].   DIET:  We do recommend a small meal at first, but then you may proceed to your regular diet.  Drink plenty of fluids but you should avoid alcoholic beverages for 24 hours.  ACTIVITY:  You should plan to take it easy for the rest of today and you should NOT DRIVE or use heavy machinery until tomorrow (because of the sedation medicines used during the test).     FOLLOW UP: Our staff will call the number listed on your records the next business day following your procedure to check on you and address any questions or concerns that you may have regarding the information given to you following your procedure. If we do not reach you, we will leave a message.  However, if you are feeling well and you are not experiencing any problems, there is no need to return our call.  We will assume that you have returned to your regular daily activities without incident.  If any biopsies were taken you will be contacted by phone or by letter within the next 1-3 weeks.  Please call us at [phone] if you have not heard about the biopsies in 3 weeks.    SIGNATURES/CONFIDENTIALITY: You and/or your care partner have signed paperwork which will be entered into your electronic medical record.  These signatures attest to the fact that that the information above on your After Visit Summary has been reviewed and is understood.  Full responsibility of the confidentiality of this discharge information lies with you and/or your care-partner. 

## 2017-06-22 NOTE — Progress Notes (Signed)
To recovery, report to RN, VSS. 

## 2017-06-23 ENCOUNTER — Telehealth: Payer: Self-pay | Admitting: *Deleted

## 2017-06-23 NOTE — Telephone Encounter (Signed)
  Follow up Call-  Call back number 06/22/2017 10/15/2015  Post procedure Call Back phone  # [phone]  Permission to leave phone message Yes Yes  Some recent data might be hidden     Patient questions:  Do you have a fever, pain , or abdominal swelling? No. Pain Score  0 *  Have you tolerated food without any problems? Yes.    Have you been able to return to your normal activities? yes  Do you have any questions about your discharge instructions: Diet   No. Medications  No. Follow up visit  No.  Do you have questions or concerns about your Care? No.  Actions: * If pain score is 4 or above: No action needed, pain <4.

## 2017-06-23 NOTE — Telephone Encounter (Signed)
  Follow up Call-  Call back number 06/22/2017 10/15/2015  Post procedure Call Back phone  # [phone]  Permission to leave phone message Yes Yes  Some recent data might be hidden     Patient questions:  No message left.  VM box has not been set up.

## 2017-08-28 ENCOUNTER — Ambulatory Visit: Payer: Self-pay | Admitting: Gastroenterology

## 2017-11-10 ENCOUNTER — Encounter: Payer: Self-pay | Admitting: Gastroenterology

## 2017-11-10 ENCOUNTER — Ambulatory Visit: Payer: BLUE CROSS/BLUE SHIELD | Admitting: Gastroenterology

## 2017-11-10 VITALS — BP 110/84 | HR 80 | Ht 62.75 in | Wt 135.1 lb

## 2017-11-10 DIAGNOSIS — R109 Unspecified abdominal pain: Secondary | ICD-10-CM

## 2017-11-10 DIAGNOSIS — K589 Irritable bowel syndrome without diarrhea: Secondary | ICD-10-CM

## 2017-11-10 DIAGNOSIS — R140 Abdominal distension (gaseous): Secondary | ICD-10-CM | POA: Diagnosis not present

## 2017-11-10 DIAGNOSIS — K219 Gastro-esophageal reflux disease without esophagitis: Secondary | ICD-10-CM | POA: Diagnosis not present

## 2017-11-10 DIAGNOSIS — R110 Nausea: Secondary | ICD-10-CM

## 2017-11-10 MED ORDER — OMEPRAZOLE 20 MG PO CPDR: 20.0000 mg | DELAYED_RELEASE_CAPSULE | Freq: Every day | ORAL | 3 refills | Status: DC

## 2017-11-10 MED ORDER — HYOSCYAMINE SULFATE SL 0.125 MG SL SUBL: 1.0000 | SUBLINGUAL_TABLET | Freq: Three times a day (TID) | SUBLINGUAL | 1 refills | Status: DC | PRN

## 2017-11-10 NOTE — Patient Instructions (Addendum)
You will receive your test kit in the mail from [facility] within 2 weeks, if you do not receive your kit please contact [facility] at [phone]  We will send Levsin to your pharmacy  Take Prilosec 20 mg daily 30 minutes before breakfast   Follow up in 3 months  If you are age 41 or older, your body mass index should be between 23-30. Your Body mass index is 24.13 kg/m. If this is out of the aforementioned range listed, please consider follow up with your Primary Care Provider.  If you are age 84 or younger, your body mass index should be between 19-25. Your Body mass index is 24.13 kg/m. If this is out of the aformentioned range listed, please consider follow up with your Primary Care Provider.    Thank you for choosing [facility]  Karleen Hampshire Nandigam,MD

## 2017-11-10 NOTE — Progress Notes (Signed)
Tracey Coleman    [id number]    03-Jan-1976  [facility] [address], [facility]  Referring Physician: [facility] [place], [place] [address]  Chief complaint:  Bloating  HPI: 42 year old female here with complaints of generalized abdominal bloating, fullness and intermittent discomfort.  She feels full even with a small meals.  She underwent hysterectomy and bilateral oophorectomy September 2018 and her symptoms have been proceeding that.  Bowel habits are currently regular and is having a bowel movement almost daily.  Denies any vomiting, dysphagia, odynophagia, melena or blood per rectum.  She has intermittent nausea when she wakes up in the morning she is not taking any medications currently.  She has had extensive GI work-up including EGD and colonoscopy unremarkable.  Normal gastric emptying study.  Abdominal ultrasound negative for significant gallbladder disease.  H. pylori negative Denies any loss of appetite or weight loss.   Outpatient Encounter Medications as of 11/10/2017  Medication Sig  . Hyoscyamine Sulfate SL (LEVSIN/SL) 0.125 MG SUBL Place 1 tablet under the tongue every 8 (eight) hours as needed.  Marland Kitchen omeprazole (PRILOSEC) 20 MG capsule Take 1 capsule (20 mg total) by mouth daily. 30 minutes before breakfast  . [DISCONTINUED] amoxicillin-clavulanate (AUGMENTIN) 875-125 MG tablet amoxicillin 875 mg-potassium clavulanate 125 mg tablet  Take 1 tablet every 12 hours by oral route for 14 days.  . [DISCONTINUED] lubiprostone (AMITIZA) 8 MCG capsule Take 1 capsule (8 mcg total) by mouth 2 (two) times daily.  . [DISCONTINUED] omeprazole (PRILOSEC) 20 MG capsule Take 1 capsule (20 mg total) by mouth daily before breakfast.   Facility-Administered Encounter Medications as of 11/10/2017  Medication  . 0.9 %  sodium chloride infusion  . gadopentetate dimeglumine (MAGNEVIST) injection 13 mL    Allergies as of 11/10/2017  - Review Complete 06/22/2017  Allergen Reaction Noted  . Azithromycin  06/22/2017  . Codeine Nausea And Vomiting 12/02/2016    Past Medical History:  Diagnosis Date  . GERD (gastroesophageal reflux disease)   . Headaches due to old head injury     Past Surgical History:  Procedure Laterality Date  . LAPAROSCOPIC VAGINAL HYSTERECTOMY WITH SALPINGECTOMY Bilateral 12/11/2016   Procedure: LAPAROSCOPIC ASSISTED VAGINAL HYSTERECTOMY WITH SALPINGECTOMY;  Surgeon: Molli Posey, MD;  Location: [facility];  Service: Gynecology;  Laterality: Bilateral;  . PLACEMENT OF BREAST IMPLANTS      Family History  Problem Relation Age of Onset  . Diabetes Mother   . Hypertension Mother   . Cancer Brother   . Seizures Neg Hx   . Migraines Neg Hx     Social History   Socioeconomic History  . Marital status: Single    Spouse name: Not on file  . Number of children: 3  . Years of education: 48  . Highest education level: Not on file  Occupational History  . Occupation: [employer]  Social Needs  . Financial resource strain: Not on file  . Food insecurity:    Worry: Not on file    Inability: Not on file  . Transportation needs:    Medical: Not on file    Non-medical: Not on file  Tobacco Use  . Smoking status: Never Smoker  . Smokeless tobacco: Never Used  Substance and Sexual Activity  . Alcohol use: No    Alcohol/week: 0.0 standard drinks  . Drug use: No  . Sexual activity: Not on file  Lifestyle  .  Physical activity:    Days per week: Not on file    Minutes per session: Not on file  . Stress: Not on file  Relationships  . Social connections:    Talks on phone: Not on file    Gets together: Not on file    Attends religious service: Not on file    Active member of club or organization: Not on file    Attends meetings of clubs or organizations: Not on file    Relationship status: Not on file  . Intimate partner violence:    Fear of current or ex partner:  Not on file    Emotionally abused: Not on file    Physically abused: Not on file    Forced sexual activity: Not on file  Other Topics Concern  . Not on file  Social History Narrative   Lives with parents and kids   Caffeine use: Drink coffee (once per day)      Review of systems: Review of Systems  Constitutional: Negative for fever and chills.  HENT: Negative.   Eyes: Negative for blurred vision.  Respiratory: Negative for cough, shortness of breath and wheezing.   Cardiovascular: Negative for chest pain and palpitations.  Gastrointestinal: as per HPI Genitourinary: Negative for dysuria, urgency, frequency and hematuria.  Musculoskeletal: Negative for myalgias, back pain and joint pain.  Skin: Negative for itching and rash.  Neurological: Negative for dizziness, tremors, focal weakness, seizures and loss of consciousness. Positive for headaches Endo/Heme/Allergies: Positive for seasonal allergies.  Psychiatric/Behavioral: Negative for depression, suicidal ideas and hallucinations.  All other systems reviewed and are negative.   Physical Exam: Vitals:   11/10/17 0854  BP: 110/84  Pulse: 80   Body mass index is 24.13 kg/m. Gen:      No acute distress HEENT:  EOMI, sclera anicteric Neck:     No masses; no thyromegaly Lungs:    Clear to auscultation bilaterally; normal respiratory effort CV:         Regular rate and rhythm; no murmurs Abd:      + bowel sounds; soft, non-tender; no palpable masses, no distension Ext:    No edema; adequate peripheral perfusion Skin:      Warm and dry; no rash Neuro: alert and oriented x 3 Psych: normal mood and affect  Data Reviewed:  Reviewed labs, radiology imaging, old records and pertinent past GI work up   Assessment and Plan/Recommendations: 42 year old female here with complaints of generalized abdominal bloating and discomfort likely secondary to irritable bowel syndrome.  Extensive GI work-up so far negative for any  significant pathology Patient may have possible small intestinal bacterial overgrowth Will request glucose breath test Intermittent nausea likely secondary to acid reflux Discussed antireflux measures and start low-dose omeprazole 20 mg daily, 30 minutes before breakfast Levsin 1 tablet 0.125 mg sublingual as needed every 8 hours for abdominal discomfort and cramping  25 minutes was spent face-to-face with the patient. Greater than 50% of the time used for counseling as well as treatment plan and follow-up. She had multiple questions which were answered to her satisfaction  K. Denzil Magnuson , MD [phone]    CC: [facility]

## 2017-11-13 ENCOUNTER — Encounter: Payer: Self-pay | Admitting: Gastroenterology

## 2018-01-13 ENCOUNTER — Telehealth: Payer: Self-pay | Admitting: Gastroenterology

## 2018-01-13 NOTE — Telephone Encounter (Signed)
Patient states she just got back from vacation and has received her breath test but is not sure how to do it. Patient requesting call from the nurse to discuss what she needs to do.

## 2018-01-13 NOTE — Telephone Encounter (Signed)
Spoke with the patient. Advised her to open the kit to look for instructions or a paper with the test kit. She is to look for a telephone number to the lab. She states understanding of this instruction.

## 2018-01-18 ENCOUNTER — Encounter: Payer: Self-pay | Admitting: Gastroenterology

## 2018-07-20 IMAGING — US US ABDOMEN COMPLETE
1 series · 14 of 25 positions shown · non-contrast
Comparison: None.

CLINICAL DATA: Epigastric pain 2 years.

EXAM:
ABDOMEN ULTRASOUND COMPLETE

[Series 1: us abdomen complete · 14 of 96 slices shown]
[im 1/96]
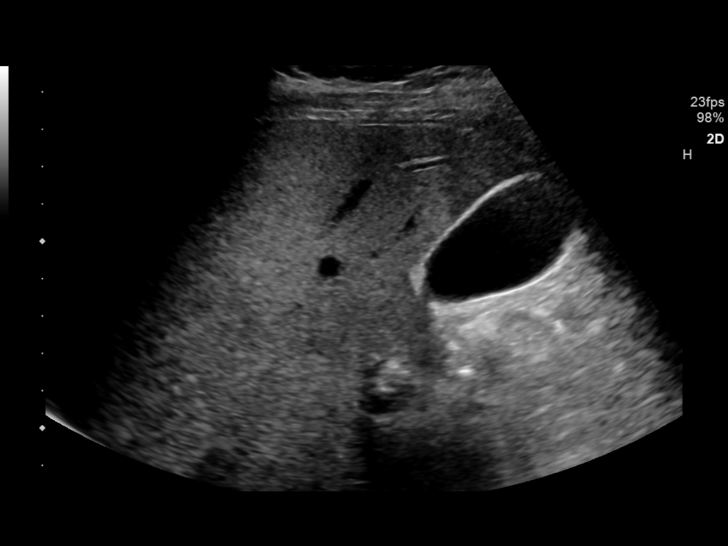
[im 8/96]
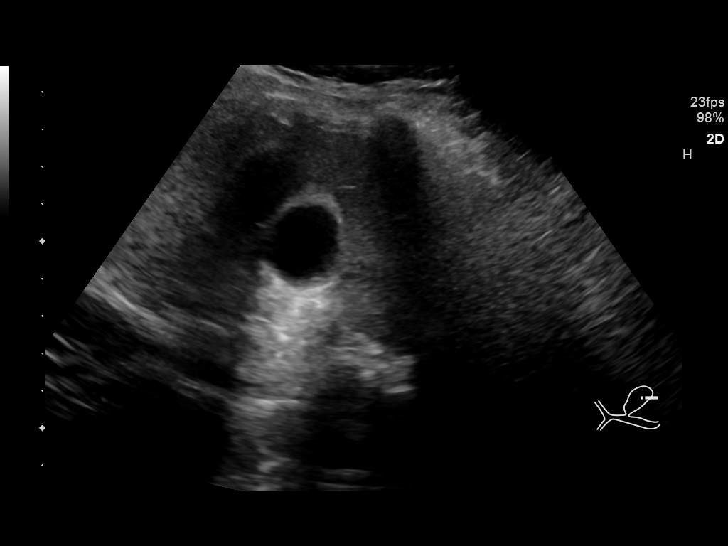
[im 16/96]
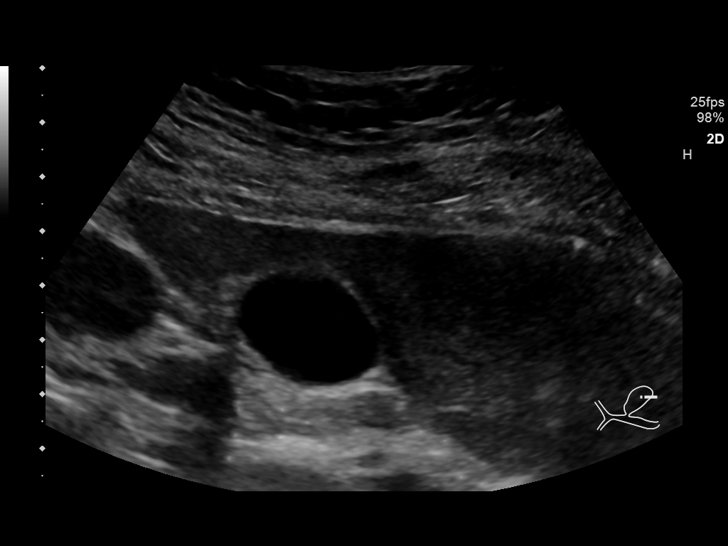
[im 24/96]
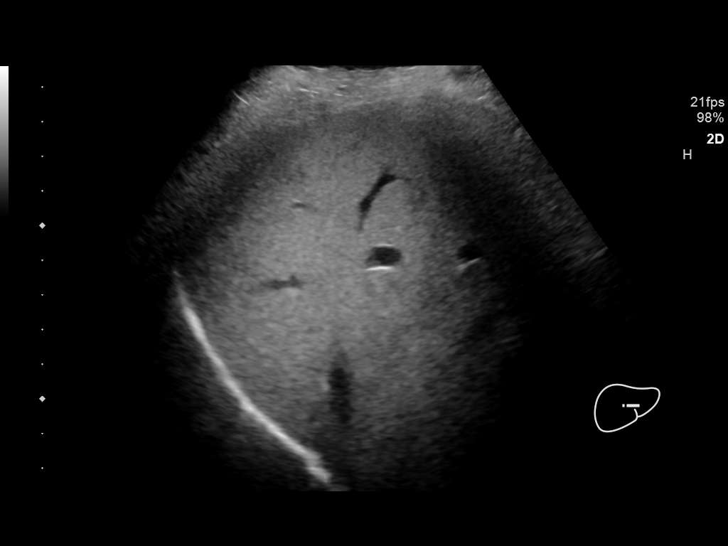
[im 32/96]
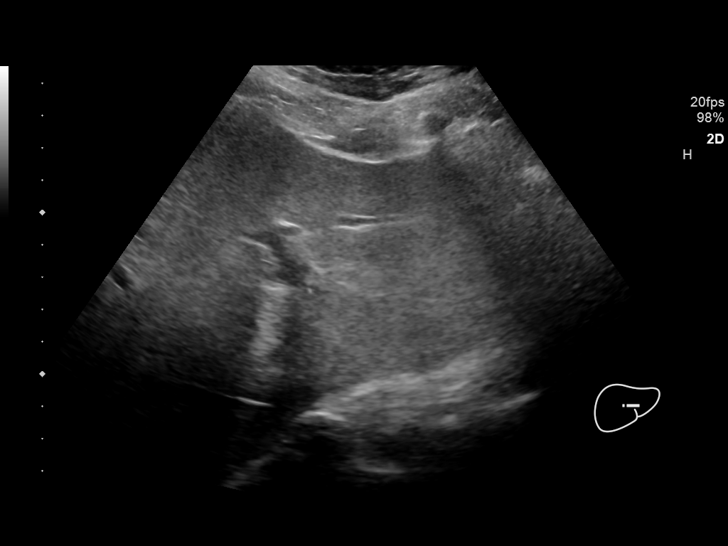
[im 36/96]
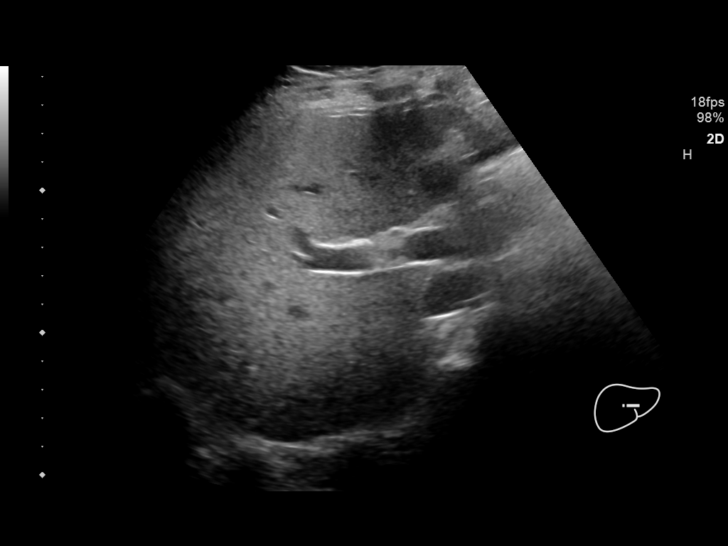
[im 44/96]
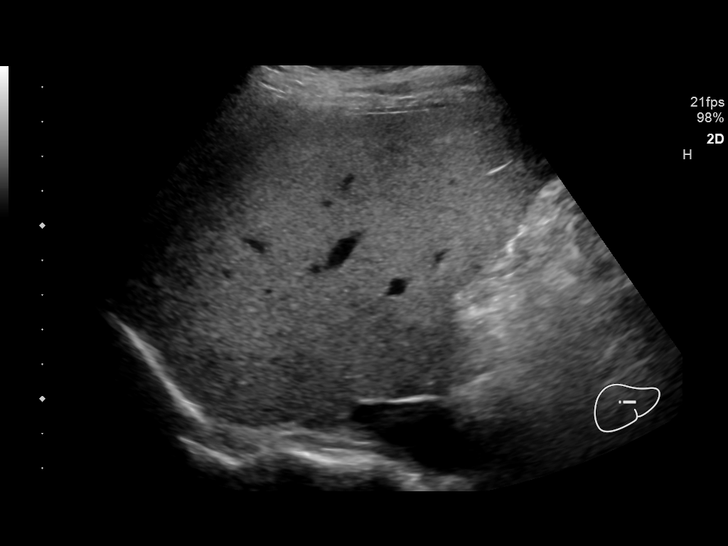
[im 52/96]
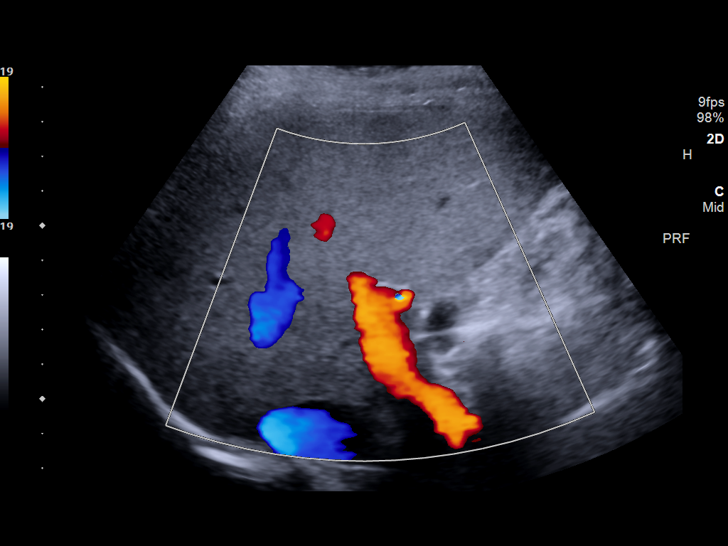
[im 60/96]
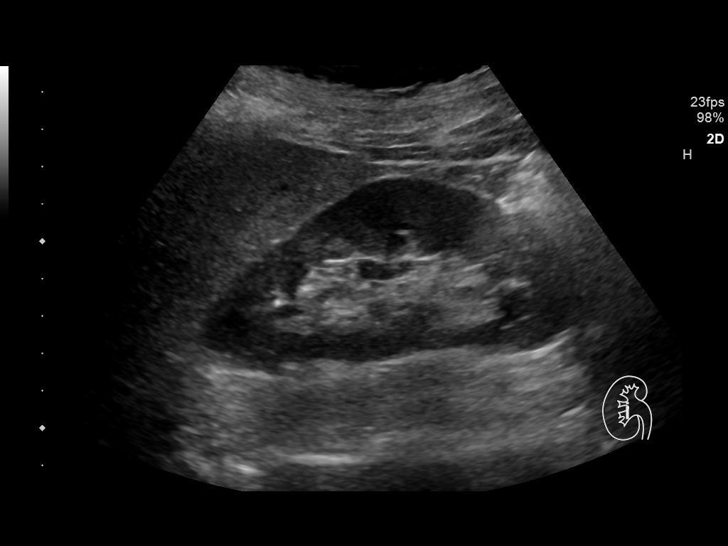
[im 64/96]
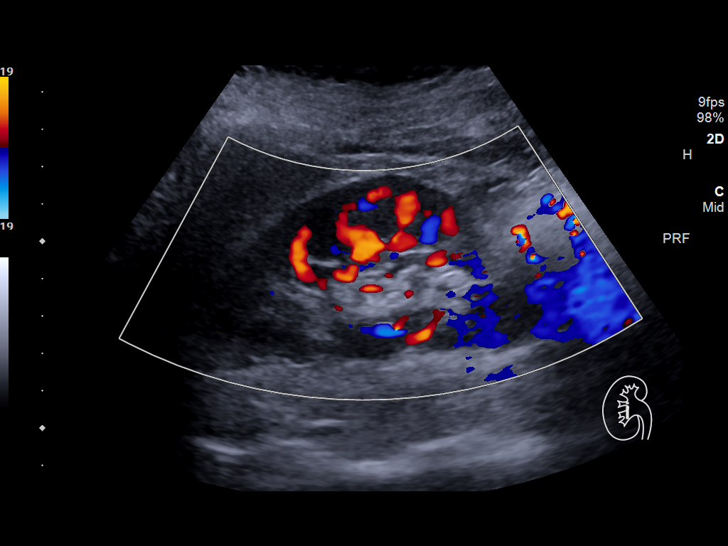
[im 72/96]
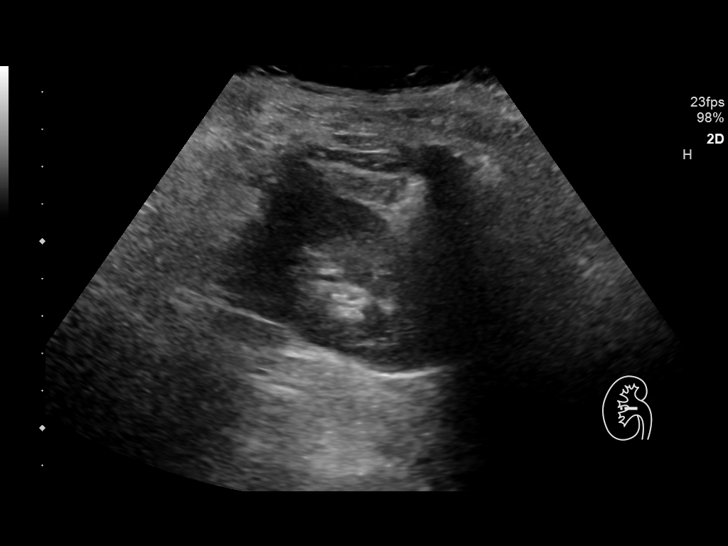
[im 80/96]
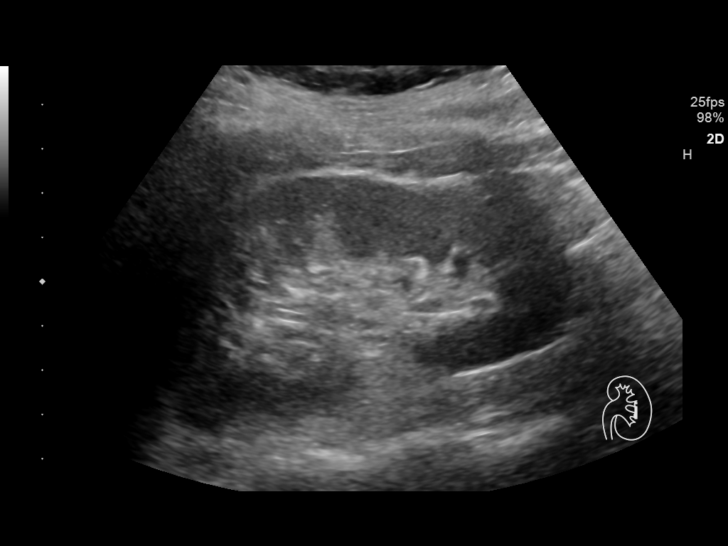
[im 88/96]
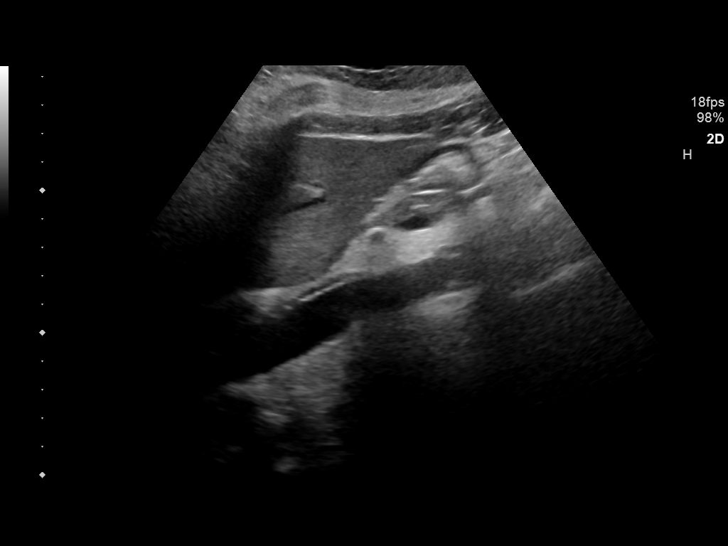
[im 96/96]
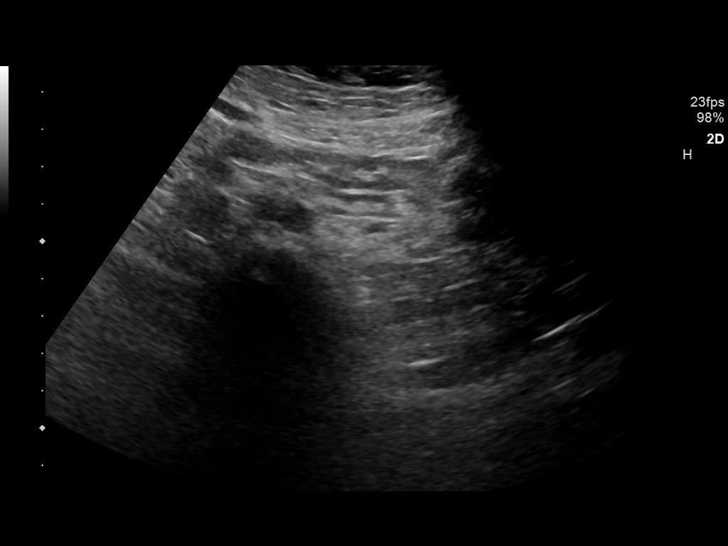

[14 of 25 positions shown; findings below may reference images not displayed]

FINDINGS: Gallbladder: No gallstones or wall thickening visualized. No
sonographic Murphy sign noted by sonographer.

Common bile duct: Diameter: 2.8 mm.

Liver: No focal lesion identified. Within normal limits in
parenchymal echogenicity. Portal vein is patent on color Doppler
imaging with normal direction of blood flow towards the liver.

IVC: No abnormality visualized.

Pancreas: Visualized portion unremarkable.

Spleen: Size and appearance within normal limits.

Right Kidney: Length: 10.4 cm. Echogenicity within normal limits. No
mass or hydronephrosis visualized.

Left Kidney: Length: 9.6 cm. Echogenicity within normal limits. No
mass or hydronephrosis visualized.

Abdominal aorta: No aneurysm visualized.

Other findings: None.
IMPRESSION: No acute findings.

## 2019-09-01 ENCOUNTER — Other Ambulatory Visit: Payer: Self-pay | Admitting: Obstetrics and Gynecology

## 2019-09-01 DIAGNOSIS — R928 Other abnormal and inconclusive findings on diagnostic imaging of breast: Secondary | ICD-10-CM

## 2019-09-08 ENCOUNTER — Ambulatory Visit
Admission: RE | Admit: 2019-09-08 | Discharge: 2019-09-08 | Disposition: A | Discharge status: Home / Self Care | Payer: BLUE CROSS/BLUE SHIELD | Source: Ambulatory Visit | Attending: Obstetrics and Gynecology | Admitting: Obstetrics and Gynecology

## 2019-09-08 ENCOUNTER — Other Ambulatory Visit: Payer: Self-pay

## 2019-09-08 ENCOUNTER — Other Ambulatory Visit: Payer: Self-pay | Admitting: Obstetrics and Gynecology

## 2019-09-08 ENCOUNTER — Ambulatory Visit
Admission: RE | Admit: 2019-09-08 | Discharge: 2019-09-08 | Disposition: A | Discharge status: Home / Self Care | Payer: 59 | Source: Ambulatory Visit | Attending: Obstetrics and Gynecology | Admitting: Obstetrics and Gynecology

## 2019-09-08 DIAGNOSIS — R928 Other abnormal and inconclusive findings on diagnostic imaging of breast: Secondary | ICD-10-CM

## 2020-11-07 ENCOUNTER — Ambulatory Visit: Payer: 59 | Admitting: Gastroenterology

## 2021-01-01 ENCOUNTER — Ambulatory Visit: Payer: 59 | Admitting: Gastroenterology

## 2021-02-01 ENCOUNTER — Encounter: Payer: Self-pay | Admitting: Gastroenterology

## 2021-03-13 ENCOUNTER — Other Ambulatory Visit: Payer: Self-pay | Admitting: Nurse Practitioner

## 2021-03-13 DIAGNOSIS — N6320 Unspecified lump in the left breast, unspecified quadrant: Secondary | ICD-10-CM

## 2021-04-15 ENCOUNTER — Ambulatory Visit: Payer: 59 | Admitting: Gastroenterology

## 2021-04-15 ENCOUNTER — Ambulatory Visit
Admission: RE | Admit: 2021-04-15 | Discharge: 2021-04-15 | Disposition: A | Discharge status: Home / Self Care | Payer: 59 | Source: Ambulatory Visit | Attending: Nurse Practitioner | Admitting: Nurse Practitioner

## 2021-04-15 ENCOUNTER — Other Ambulatory Visit: Payer: Self-pay | Admitting: Nurse Practitioner

## 2021-04-15 ENCOUNTER — Ambulatory Visit
Admission: RE | Admit: 2021-04-15 | Discharge: 2021-04-15 | Disposition: A | Discharge status: Home / Self Care | Payer: PRIVATE HEALTH INSURANCE | Source: Ambulatory Visit | Attending: Nurse Practitioner | Admitting: Nurse Practitioner

## 2021-04-15 DIAGNOSIS — N6320 Unspecified lump in the left breast, unspecified quadrant: Secondary | ICD-10-CM

## 2021-05-21 ENCOUNTER — Other Ambulatory Visit: Payer: Managed Care, Other (non HMO)

## 2021-05-21 ENCOUNTER — Encounter: Payer: Self-pay | Admitting: Gastroenterology

## 2021-05-21 ENCOUNTER — Ambulatory Visit: Payer: Managed Care, Other (non HMO) | Admitting: Gastroenterology

## 2021-05-21 VITALS — BP 136/84 | HR 70 | Ht 61.0 in | Wt 143.0 lb

## 2021-05-21 DIAGNOSIS — K219 Gastro-esophageal reflux disease without esophagitis: Secondary | ICD-10-CM

## 2021-05-21 DIAGNOSIS — K5904 Chronic idiopathic constipation: Secondary | ICD-10-CM

## 2021-05-21 DIAGNOSIS — R1012 Left upper quadrant pain: Secondary | ICD-10-CM | POA: Diagnosis not present

## 2021-05-21 MED ORDER — LANSOPRAZOLE 30 MG PO CPDR: 30.0000 mg | DELAYED_RELEASE_CAPSULE | Freq: Every day | ORAL | 3 refills | Status: DC

## 2021-05-21 MED ORDER — LINACLOTIDE 145 MCG PO CAPS: 145.0000 ug | ORAL_CAPSULE | Freq: Every day | ORAL | 3 refills | Status: DC

## 2021-05-21 NOTE — Patient Instructions (Signed)
Your provider has requested that you go to the basement level for lab work before leaving today. Press "B" on the elevator. The lab is located at the first door on the left as you exit the elevator.  ? ?We have sent the following medications to your pharmacy for you to pick up at your convenience:   Linzess  Lansoprazole ? ?Gastroesophageal Reflux Disease, Adult ?Gastroesophageal reflux (GER) happens when acid from the stomach flows up into the tube that connects the mouth and the stomach (esophagus). Normally, food travels down the esophagus and stays in the stomach to be digested. However, when a person has GER, food and stomach acid sometimes move back up into the esophagus. If this becomes a more serious problem, the person may be diagnosed with a disease called gastroesophageal reflux disease (GERD). GERD occurs when the reflux: ?Happens often. ?Causes frequent or severe symptoms. ?Causes problems such as damage to the esophagus. ?When stomach acid comes in contact with the esophagus, the acid may cause inflammation in the esophagus. Over time, GERD may create small holes (ulcers) in the lining of the esophagus. ?What are the causes? ?This condition is caused by a problem with the muscle between the esophagus and the stomach (lower esophageal sphincter, or LES). Normally, the LES muscle closes after food passes through the esophagus to the stomach. When the LES is weakened or abnormal, it does not close properly, and that allows food and stomach acid to go back up into the esophagus. ?The LES can be weakened by certain dietary substances, medicines, and medical conditions, including: ?Tobacco use. ?Pregnancy. ?Having a hiatal hernia. ?Alcohol use. ?Certain foods and beverages, such as coffee, chocolate, onions, and peppermint. ?What increases the risk? ?You are more likely to develop this condition if you: ?Have an increased body weight. ?Have a connective tissue disorder. ?Take NSAIDs, such as ibuprofen. ?What  are the signs or symptoms? ?Symptoms of this condition include: ?Heartburn. ?Difficult or painful swallowing and the feeling of having a lump in the throat. ?A bitter taste in the mouth. ?Bad breath and having a large amount of saliva. ?Having an upset or bloated stomach and belching. ?Chest pain. Different conditions can cause chest pain. Make sure you see your health care provider if you experience chest pain. ?Shortness of breath or wheezing. ?Ongoing (chronic) cough or a nighttime cough. ?Wearing away of tooth enamel. ?Weight loss. ?How is this diagnosed? ?This condition may be diagnosed based on a medical history and a physical exam. To determine if you have mild or severe GERD, your health care provider may also monitor how you respond to treatment. You may also have tests, including: ?A test to examine your stomach and esophagus with a small camera (endoscopy). ?A test that measures the acidity level in your esophagus. ?A test that measures how much pressure is on your esophagus. ?A barium swallow or modified barium swallow test to show the shape, size, and functioning of your esophagus. ?How is this treated? ?Treatment for this condition may vary depending on how severe your symptoms are. Your health care provider may recommend: ?Changes to your diet. ?Medicine. ?Surgery. ?The goal of treatment is to help relieve your symptoms and to prevent complications. ?Follow these instructions at home: ?Eating and drinking ? ?Follow a diet as recommended by your health care provider. This may involve avoiding foods and drinks such as: ?Coffee and tea, with or without caffeine. ?Drinks that contain alcohol. ?Energy drinks and sports drinks. ?Carbonated drinks or sodas. ?Chocolate and cocoa. ?  Peppermint and mint flavorings. ?Garlic and onions. ?Horseradish. ?Spicy and acidic foods, including peppers, chili powder, curry powder, vinegar, hot sauces, and barbecue sauce. ?Citrus fruit juices and citrus fruits, such as  oranges, lemons, and limes. ?Tomato-based foods, such as red sauce, chili, salsa, and pizza with red sauce. ?Fried and fatty foods, such as donuts, french fries, potato chips, and high-fat dressings. ?High-fat meats, such as hot dogs and fatty cuts of red and white meats, such as rib eye steak, sausage, ham, and bacon. ?High-fat dairy items, such as whole milk, butter, and cream cheese. ?Eat small, frequent meals instead of large meals. ?Avoid drinking large amounts of liquid with your meals. ?Avoid eating meals during the 2-3 hours before bedtime. ?Avoid lying down right after you eat. ?Do not exercise right after you eat. ?Lifestyle ? ?Do not use any products that contain nicotine or tobacco. These products include cigarettes, chewing tobacco, and vaping devices, such as e-cigarettes. If you need help quitting, ask your health care provider. ?Try to reduce your stress by using methods such as yoga or meditation. If you need help reducing stress, ask your health care provider. ?If you are overweight, reduce your weight to an amount that is healthy for you. Ask your health care provider for guidance about a safe weight loss goal. ?General instructions ?Pay attention to any changes in your symptoms. ?Take over-the-counter and prescription medicines only as told by your health care provider. Do not take aspirin, ibuprofen, or other NSAIDs unless your health care provider told you to take these medicines. ?Wear loose-fitting clothing. Do not wear anything tight around your waist that causes pressure on your abdomen. ?Raise (elevate) the head of your bed about 6 inches (15 cm). You can use a wedge to do this. ?Avoid bending over if this makes your symptoms worse. ?Keep all follow-up visits. This is important. ?Contact a health care provider if: ?You have: ?New symptoms. ?Unexplained weight loss. ?Difficulty swallowing or it hurts to swallow. ?Wheezing or a persistent cough. ?A hoarse voice. ?Your symptoms do not improve  with treatment. ?Get help right away if: ?You have sudden pain in your arms, neck, jaw, teeth, or back. ?You suddenly feel sweaty, dizzy, or light-headed. ?You have chest pain or shortness of breath. ?You vomit and the vomit is green, yellow, or black, or it looks like blood or coffee grounds. ?You faint. ?You have stool that is red, bloody, or black. ?You cannot swallow, drink, or eat. ?These symptoms may represent a serious problem that is an emergency. Do not wait to see if the symptoms will go away. Get medical help right away. Call your local emergency services (911 in the [place] Do not drive yourself to the hospital. ?Summary ?Gastroesophageal reflux happens when acid from the stomach flows up into the esophagus. GERD is a disease in which the reflux happens often, causes frequent or severe symptoms, or causes problems such as damage to the esophagus. ?Treatment for this condition may vary depending on how severe your symptoms are. Your health care provider may recommend diet and lifestyle changes, medicine, or surgery. ?Contact a health care provider if you have new or worsening symptoms. ?Take over-the-counter and prescription medicines only as told by your health care provider. Do not take aspirin, ibuprofen, or other NSAIDs unless your health care provider told you to do so. ?Keep all follow-up visits as told by your health care provider. This is important. ?This information is not intended to replace advice given to you by your health  care provider. Make sure you discuss any questions you have with your health care provider. ?Document Revised: 09/12/2019 Document Reviewed: 09/12/2019 ?Elsevier Patient Education ? [place]. ? ?I appreciate the  opportunity to care for you ? ?Thank You  ? ?Harl Bowie , MD  ? ?

## 2021-05-21 NOTE — Progress Notes (Unsigned)
Tracey Coleman    [id number]    [birth year]  [facility], [facility]  Referring Physician: [facility] [place],  [place] [address]   Chief complaint:  Nausea, bloating, gag and cough, bitter taste  HPI:  46 year old female here with complaints of generalized abdominal bloating, fullness and intermittent discomfort.    She feels full even with a small meals.  She underwent hysterectomy and bilateral oophorectomy September 2018 and her symptoms have been proceeding that.  Bowel habits are currently regular and is having a bowel movement almost daily.  Denies any vomiting, dysphagia, odynophagia, melena or blood per rectum.  She has intermittent nausea when she wakes up in the morning she is not taking any medications currently.    She has had extensive GI work-up including EGD and colonoscopy unremarkable.  Normal gastric emptying study.  Abdominal ultrasound negative for significant gallbladder disease.  H. pylori negative Denies any loss of appetite or weight loss.       Outpatient Encounter Medications as of 05/21/2021  Medication Sig   [DISCONTINUED] Hyoscyamine Sulfate SL (LEVSIN/SL) 0.125 MG SUBL Place 1 tablet under the tongue every 8 (eight) hours as needed.   [DISCONTINUED] omeprazole (PRILOSEC) 20 MG capsule Take 1 capsule (20 mg total) by mouth daily. 30 minutes before breakfast   Facility-Administered Encounter Medications as of 05/21/2021  Medication   0.9 %  sodium chloride infusion   gadopentetate dimeglumine (MAGNEVIST) injection 13 mL    Allergies as of 05/21/2021 - Review Complete 05/21/2021  Allergen Reaction Noted   Azithromycin  06/22/2017   Codeine Nausea And Vomiting 12/02/2016    Past Medical History:  Diagnosis Date   GERD (gastroesophageal reflux disease)    Headaches due to old head injury     Past Surgical History:  Procedure Laterality Date   AUGMENTATION MAMMAPLASTY  Bilateral 2012   LAPAROSCOPIC VAGINAL HYSTERECTOMY WITH SALPINGECTOMY Bilateral 12/11/2016   Procedure: LAPAROSCOPIC ASSISTED VAGINAL HYSTERECTOMY WITH SALPINGECTOMY;  Surgeon: Molli Posey, MD;  Location: [facility];  Service: Gynecology;  Laterality: Bilateral;   PLACEMENT OF BREAST IMPLANTS      Family History  Problem Relation Age of Onset   Diabetes Mother    Hypertension Mother    Cancer Brother    Seizures Neg Hx    Migraines Neg Hx     Social History   Socioeconomic History   Marital status: Single    Spouse name: Not on file   Number of children: 3   Years of education: 11   Highest education level: Not on file  Occupational History   Occupation: [employer]  Tobacco Use   Smoking status: Never   Smokeless tobacco: Never  Substance and Sexual Activity   Alcohol use: No    Alcohol/week: 0.0 standard drinks   Drug use: No   Sexual activity: Not on file  Other Topics Concern   Not on file  Social History Narrative   Lives with parents and kids   Caffeine use: Drink coffee (once per day)   Social Determinants of Health   Financial Resource Strain: Not on file  Food Insecurity: Not on file  Transportation Needs: Not on file  Physical Activity: Not on file  Stress: Not on file  Social Connections: Not on file  Intimate Partner Violence: Not on file      Review of systems: All other review of systems negative  except as mentioned in the HPI.   Physical Exam: Vitals:   05/21/21 1348  BP: 136/84  Pulse: 70   Body mass index is 27.02 kg/m. Gen:      No acute distress HEENT:  sclera anicteric Abd:      soft, non-tender; no palpable masses, no distension Ext:    No edema Neuro: alert and oriented x 3 Psych: normal mood and affect  Data Reviewed:  Reviewed labs, radiology imaging, old records and pertinent past GI work up   Assessment and Plan/Recommendations:  ***  This visit required *** minutes of patient care (this  includes precharting, chart review, review of results, face-to-face time used for counseling as well as treatment plan and follow-up. The patient was provided an opportunity to ask questions and all were answered. The patient agreed with the plan and demonstrated an understanding of the instructions.  Damaris Hippo , MD    CC: [place]

## 2021-05-23 ENCOUNTER — Telehealth: Payer: Self-pay | Admitting: Gastroenterology

## 2021-05-23 MED ORDER — LANSOPRAZOLE 30 MG PO CPDR: DELAYED_RELEASE_CAPSULE | ORAL | 3 refills | Status: DC

## 2021-05-23 MED ORDER — LINACLOTIDE 145 MCG PO CAPS: 145.0000 ug | ORAL_CAPSULE | Freq: Every day | ORAL | 3 refills | Status: DC

## 2021-05-23 NOTE — Telephone Encounter (Addendum)
Patient called and stated that she went to the pharmacy to get her medications and they only had Lansoprazole. Patient is requesting that Linzess be resent to pharmacy. Please advise.  ? ? ?Also, patient stated that for Lansoprazole it says to take 1 capsule at 12 and then 30 minutes before breakfast. Patient is wanting some clarification on how and when to take the medication.  ?

## 2021-05-23 NOTE — Telephone Encounter (Signed)
Sent in Linzess 145 mcg again. It was sent in yesterday    Also revised the lansoprazole rx to say 30 minutes before breakfast    ? ?

## 2021-05-27 ENCOUNTER — Encounter: Payer: Self-pay | Admitting: Gastroenterology

## 2021-05-27 NOTE — Progress Notes (Signed)
? ?       ? ?Tracey Coleman    [id number]    Oct 26, 1975 ? ?Primary Care Physician:[facility], [facility] ? ?Referring Physician: [facility] ?[address] [place] ?[place],  [place] [address] ? ? ?Chief complaint:  Nausea, bloating, gag and cough, bitter taste ? ?HPI: ? ?46 year old very pleasant female here with complaints of generalized abdominal bloating, fullness and intermittent discomfort.  She has bitter taste worse in the morning ? ?She feels full even with a small meals.  She has intermittent constipation ? ?She has intermittent nausea when she wakes up in the morning and feels bitter taste makes her symptom worse ? ?Denies any vomiting, dysphagia, odynophagia, melena or blood per rectum.   ?Denies any loss of appetite or weight loss. ? ?She has had extensive GI work-up including EGD and colonoscopy unremarkable.  Normal gastric emptying study.  Abdominal ultrasound negative for significant gallbladder disease.   ?H. pylori negative ?  ? She underwent hysterectomy and bilateral oophorectomy September 2018  ? ?Outpatient Encounter Medications as of 05/21/2021  ?Medication Sig  ? [DISCONTINUED] Hyoscyamine Sulfate SL (LEVSIN/SL) 0.125 MG SUBL Place 1 tablet under the tongue every 8 (eight) hours as needed.  ? [DISCONTINUED] omeprazole (PRILOSEC) 20 MG capsule Take 1 capsule (20 mg total) by mouth daily. 30 minutes before breakfast  ? ?Facility-Administered Encounter Medications as of 05/21/2021  ?Medication  ? 0.9 %  sodium chloride infusion  ? gadopentetate dimeglumine (MAGNEVIST) injection 13 mL  ? ? ?Allergies as of 05/21/2021 - Review Complete 05/21/2021  ?Allergen Reaction Noted  ? Azithromycin  06/22/2017  ? Codeine Nausea And Vomiting 12/02/2016  ? ? ?Past Medical History:  ?Diagnosis Date  ? GERD (gastroesophageal reflux disease)   ? Headaches due to old head injury   ? ? ?Past Surgical History:  ?Procedure Laterality Date  ? AUGMENTATION MAMMAPLASTY Bilateral 2012  ? LAPAROSCOPIC  VAGINAL HYSTERECTOMY WITH SALPINGECTOMY Bilateral 12/11/2016  ? Procedure: LAPAROSCOPIC ASSISTED VAGINAL HYSTERECTOMY WITH SALPINGECTOMY;  Surgeon: Molli Posey, MD;  Location: [facility];  Service: Gynecology;  Laterality: Bilateral;  ? PLACEMENT OF BREAST IMPLANTS    ? ? ?Family History  ?Problem Relation Age of Onset  ? Diabetes Mother   ? Hypertension Mother   ? Cancer Brother   ? Seizures Neg Hx   ? Migraines Neg Hx   ? ? ?Social History  ? ?Socioeconomic History  ? Marital status: Single  ?  Spouse name: Not on file  ? Number of children: 3  ? Years of education: 63  ? Highest education level: Not on file  ?Occupational History  ? Occupation: [employer]  ?Tobacco Use  ? Smoking status: Never  ? Smokeless tobacco: Never  ?Substance and Sexual Activity  ? Alcohol use: No  ?  Alcohol/week: 0.0 standard drinks  ? Drug use: No  ? Sexual activity: Not on file  ?Other Topics Concern  ? Not on file  ?Social History Narrative  ? Lives with parents and kids  ? Caffeine use: Drink coffee (once per day)  ? ?Social Determinants of Health  ? ?Financial Resource Strain: Not on file  ?Food Insecurity: Not on file  ?Transportation Needs: Not on file  ?Physical Activity: Not on file  ?Stress: Not on file  ?Social Connections: Not on file  ?Intimate Partner Violence: Not on file  ? ? ? ? ?Review of systems: ?All other review of systems negative except as mentioned in the HPI. ? ? ?  Physical Exam: ?Vitals:  ? 05/21/21 1348  ?BP: 136/84  ?Pulse: 70  ? ?Body mass index is 27.02 kg/m?. ?Gen:      No acute distress ?HEENT:  sclera anicteric ?Abd:      soft, non-tender; no palpable masses, no distension ?Ext:    No edema ?Neuro: alert and oriented x 3 ?Psych: normal mood and affect ? ?Data Reviewed: ? ?Reviewed labs, radiology imaging, old records and pertinent past GI work up ? ? ?Assessment and Plan/Recommendations: ? ?46 yr old very pleasant female with h/o chronic idiopathi constipation with c/o genralized  abdominal bloating, nausea and bitter taste ? ?Her symptoms are likely secondary to exacerbation of chronic constipation and GERD ? ?Chronic idiopathic constipation ?Start Linzess 149mg daily, titrate dose based on response ?High fiber diet ?Increase water intake to 8-10 cups daily ? ?GERD: Start Lansoprazole '30mg'$  daily ?Antireflux measures ? ?If has persistent symptoms will consider lactulose breath test to exclude small intestinal bacterial overgrowth ? ?This visit required 40 minutes of patient care (this includes precharting, chart review, review of results, face-to-face time used for counseling as well as treatment plan and follow-up. The patient was provided an opportunity to ask questions and all were answered. The patient agreed with the plan and demonstrated an understanding of the instructions. ? ?K. VDenzil Magnuson, MD ?  ? ?CC: [employer]* ? ? ?

## 2021-06-28 ENCOUNTER — Telehealth: Payer: Self-pay | Admitting: Gastroenterology

## 2021-06-28 NOTE — Telephone Encounter (Signed)
Inbound call from patient stating that she took her last Lansoprazole today and it seem that it is not helping her. Patient is seeking advice if there is something else she can take. Please advise.  ?

## 2021-06-28 NOTE — Telephone Encounter (Signed)
Spoke with the patient. ?She is having constipation despite Linzess 145 mcg. She reports this worked for her at first. After a few days it seemed to work less and less. Now she has not had a good bowel movement in a week. Passing small amount of stool. She feels bloated and uncomfortable. She will eat foods that she knows to make her bowels move and await guidance. ?Please advise. ?

## 2021-06-28 NOTE — Telephone Encounter (Signed)
Called the patient to discuss. No answer. No voicemail. ?

## 2021-07-01 MED ORDER — LINZESS 290 MCG PO CAPS: 290.0000 ug | ORAL_CAPSULE | Freq: Every day | ORAL | 0 refills | Status: DC

## 2021-07-01 NOTE — Telephone Encounter (Signed)
Spoke with the patient and advised of recommendations. Patient is out of her Linzess now. She will come by the office and pick up #12 samples of Linzess 290 mcg. Agrees to call with update of her response before she finishes the samples. Instructed to take the Linzess with her breakfast, not on an empty stomach. ?

## 2021-07-01 NOTE — Telephone Encounter (Signed)
Please advise patient to take Linzess after breakfast instead of empty stomach and if she is still not feeling she has adequate response, we will plan to increase the dose to 290 mcg daily.  Advise her to take 2 capsules daily of 145 mcg capsules that she has.  Please advise patient to call back if continues to have persistent symptoms.  Thank you ?

## 2021-07-31 ENCOUNTER — Ambulatory Visit (INDEPENDENT_AMBULATORY_CARE_PROVIDER_SITE_OTHER): Payer: Commercial Managed Care - HMO | Admitting: Gastroenterology

## 2021-07-31 ENCOUNTER — Encounter: Payer: Self-pay | Admitting: Gastroenterology

## 2021-07-31 ENCOUNTER — Other Ambulatory Visit (INDEPENDENT_AMBULATORY_CARE_PROVIDER_SITE_OTHER): Payer: Commercial Managed Care - HMO

## 2021-07-31 VITALS — BP 120/88 | HR 82 | Ht 61.0 in | Wt 145.0 lb

## 2021-07-31 DIAGNOSIS — R1013 Epigastric pain: Secondary | ICD-10-CM

## 2021-07-31 DIAGNOSIS — R142 Eructation: Secondary | ICD-10-CM | POA: Diagnosis not present

## 2021-07-31 DIAGNOSIS — R140 Abdominal distension (gaseous): Secondary | ICD-10-CM

## 2021-07-31 DIAGNOSIS — K5904 Chronic idiopathic constipation: Secondary | ICD-10-CM

## 2021-07-31 LAB — H. PYLORI ANTIBODY, IGG: H Pylori IgG: NEGATIVE

## 2021-07-31 MED ORDER — OMEPRAZOLE 40 MG PO CPDR: 40.0000 mg | DELAYED_RELEASE_CAPSULE | Freq: Every day | ORAL | 3 refills | Status: DC

## 2021-07-31 NOTE — Patient Instructions (Signed)
Your provider has requested that you go to the basement level for lab work before leaving today. Press "B" on the elevator. The lab is located at the first door on the left as you exit the elevator.  ? ?We have sent the following medications to your pharmacy for you to pick up at your convenience:  Omeprazole  ? ?Take Miralax 1 capful daily ? ?Discontinue Lansoprazole and Linzess  Follow up in 3 months ? ?Due to recent changes in healthcare laws, you may see the results of your imaging and laboratory studies on MyChart before your provider has had a chance to review them.  We understand that in some cases there may be results that are confusing or concerning to you. Not all laboratory results come back in the same time frame and the provider may be waiting for multiple results in order to interpret others.  Please give [place] 48 hours in order for your provider to thoroughly review all the results before contacting the office for clarification of your results.   ? ?Thank you for choosing [facility] ? ?Kavitha Nandigam,MD  ?

## 2021-07-31 NOTE — Progress Notes (Signed)
Tracey Coleman    [id number]    [birth year]  Primary Care [facility], [facility]  Referring Physician: [facility] [place],  [place] [address]   Chief complaint: Abdominal pain   HPI:  46 year old very pleasant female here with complaints of generalized abdominal bloating, fullness and intermittent discomfort.  She has bitter taste worse in the morning  No improvement of symptoms with lansoprazole or Linzess.   She feels full even with a small meals.  She has constant urge to belch.  She has intermittent nausea when she wakes up in the morning and feels bitter taste makes her symptom worse  She continues to have intermittent constipation.  Her sister had similar symptoms and improved after she was treated for infection.  Her mother and brother had stomach cancer  She has had an EGD with gastric biopsies that were negative for H. pylori   Denies any vomiting, dysphagia, odynophagia, melena or blood per rectum.   Denies any loss of appetite or weight loss.   She has had extensive GI work-up including EGD and colonoscopy unremarkable.  Normal gastric emptying study.  Abdominal ultrasound negative for significant gallbladder disease.   H. pylori negative    She underwent hysterectomy and bilateral oophorectomy September 2018    Outpatient Encounter Medications as of 07/31/2021  Medication Sig   lansoprazole (PREVACID) 30 MG capsule 30 minutes before breakfast (Patient not taking: Reported on 07/31/2021)   LINZESS 290 MCG CAPS capsule Take 1 capsule (290 mcg total) by mouth daily with breakfast. (Patient not taking: Reported on 07/31/2021)   Facility-Administered Encounter Medications as of 07/31/2021  Medication   0.9 %  sodium chloride infusion   gadopentetate dimeglumine (MAGNEVIST) injection 13 mL    Allergies as of 07/31/2021 - Review Complete 07/31/2021  Allergen Reaction Noted   Azithromycin  06/22/2017    Codeine Nausea And Vomiting 12/02/2016    Past Medical History:  Diagnosis Date   GERD (gastroesophageal reflux disease)    Headaches due to old head injury     Past Surgical History:  Procedure Laterality Date   AUGMENTATION MAMMAPLASTY Bilateral 2012   LAPAROSCOPIC VAGINAL HYSTERECTOMY WITH SALPINGECTOMY Bilateral 12/11/2016   Procedure: LAPAROSCOPIC ASSISTED VAGINAL HYSTERECTOMY WITH SALPINGECTOMY;  Surgeon: Molli Posey, MD;  Location: [facility];  Service: Gynecology;  Laterality: Bilateral;   PLACEMENT OF BREAST IMPLANTS      Family History  Problem Relation Age of Onset   Diabetes Mother    Hypertension Mother    Cancer Brother    Seizures Neg Hx    Migraines Neg Hx     Social History   Socioeconomic History   Marital status: Single    Spouse name: Not on file   Number of children: 3   Years of education: 11   Highest education level: Not on file  Occupational History   Occupation: [employer]  Tobacco Use   Smoking status: Never   Smokeless tobacco: Never  Substance and Sexual Activity   Alcohol use: No    Alcohol/week: 0.0 standard drinks   Drug use: No   Sexual activity: Not on file  Other Topics Concern   Not on file  Social History Narrative   Lives with parents and kids   Caffeine use: Drink coffee (once per day)   Social Determinants of Health   Financial Resource Strain: Not on file  Food Insecurity: Not on  file  Transportation Needs: Not on file  Physical Activity: Not on file  Stress: Not on file  Social Connections: Not on file  Intimate Partner Violence: Not on file      Review of systems: All other review of systems negative except as mentioned in the HPI.   Physical Exam: Vitals:   07/31/21 1048  BP: 120/88  Pulse: 82   Body mass index is 27.4 kg/m. Gen:      No acute distress HEENT:  sclera anicteric Abd:      soft, non-tender; no palpable masses, no distension Ext:    No edema Neuro: alert and  oriented x 3 Psych: normal mood and affect  Data Reviewed:  Reviewed labs, radiology imaging, old records and pertinent past GI work up   Assessment and Plan/Recommendations:  46 yr old very pleasant female with h/o chronic idiopathi constipation with c/o genralized abdominal bloating, nausea and bitter taste   GERD: Persistent GERD symptoms despite daily lansoprazole, will switch to omeprazole 40 mg daily Antireflux measures We will check H. pylori IgG antibody and treat if positive given significant family history   Chronic idiopathic constipation No improvement with Linzess.  Use MiraLAX 1 capful daily and titrate dose based on response High fiber diet Increase water intake to 8-10 cups daily   Possible small intestinal bacterial overgrowth exacerbating her symptoms including abdominal bloating and discomfort, will treat with Xifaxan 550 mg 3 times daily for 10 days  Return in 3 months or sooner if needed   The patient was provided an opportunity to ask questions and all were answered. The patient agreed with the plan and demonstrated an understanding of the instructions.  Damaris Hippo , MD    CC: [place]

## 2021-08-05 ENCOUNTER — Other Ambulatory Visit: Payer: Self-pay

## 2021-08-05 MED ORDER — RIFAXIMIN 550 MG PO TABS: 550.0000 mg | ORAL_TABLET | Freq: Three times a day (TID) | ORAL | 0 refills | Status: AC

## 2021-08-06 ENCOUNTER — Encounter: Payer: Self-pay | Admitting: Gastroenterology

## 2021-08-06 ENCOUNTER — Other Ambulatory Visit (HOSPITAL_COMMUNITY): Payer: Self-pay

## 2021-08-06 ENCOUNTER — Telehealth: Payer: Self-pay | Admitting: Pharmacy Technician

## 2021-08-06 NOTE — Telephone Encounter (Signed)
Patient Advocate Encounter  Received notification from [place] that prior authorization for [facility] '550MG'$  is required.   PA submitted on 5.23.23 Key [id number] Status is pending   [facility] will continue to follow  Luciano Cutter, CPhT Patient Advocate Phone: [phone]

## 2021-08-15 ENCOUNTER — Other Ambulatory Visit (HOSPITAL_COMMUNITY): Payer: Self-pay

## 2021-08-15 NOTE — Telephone Encounter (Signed)
Received notification from [place] regarding a prior authorization for XIFAXAN '550MG'$ . Authorization has been APPROVED from 05.18.23 to 6.14.23.   Per test claim, copay for 10 days supply is $136.86. Pharmacy is processing.

## 2021-10-14 ENCOUNTER — Other Ambulatory Visit: Payer: Self-pay | Admitting: Nurse Practitioner

## 2021-10-14 ENCOUNTER — Ambulatory Visit
Admission: RE | Admit: 2021-10-14 | Discharge: 2021-10-14 | Disposition: A | Discharge status: Home / Self Care | Payer: Commercial Managed Care - HMO | Source: Ambulatory Visit | Attending: Nurse Practitioner | Admitting: Nurse Practitioner

## 2021-10-14 DIAGNOSIS — N6320 Unspecified lump in the left breast, unspecified quadrant: Secondary | ICD-10-CM

## 2021-11-10 ENCOUNTER — Encounter (HOSPITAL_COMMUNITY): Payer: Self-pay

## 2021-11-10 ENCOUNTER — Observation Stay (HOSPITAL_COMMUNITY): Payer: Commercial Managed Care - HMO

## 2021-11-10 ENCOUNTER — Emergency Department (HOSPITAL_COMMUNITY): Payer: Commercial Managed Care - HMO

## 2021-11-10 ENCOUNTER — Observation Stay (HOSPITAL_COMMUNITY)
Admission: EM | Admit: 2021-11-10 | Discharge: 2021-11-12 | Disposition: A | Discharge status: Home / Self Care | Payer: Commercial Managed Care - HMO | Attending: Neurosurgery | Admitting: Neurosurgery

## 2021-11-10 DIAGNOSIS — S065X0A Traumatic subdural hemorrhage without loss of consciousness, initial encounter: Secondary | ICD-10-CM | POA: Diagnosis not present

## 2021-11-10 DIAGNOSIS — Y92410 Unspecified street and highway as the place of occurrence of the external cause: Secondary | ICD-10-CM | POA: Insufficient documentation

## 2021-11-10 DIAGNOSIS — M542 Cervicalgia: Secondary | ICD-10-CM | POA: Insufficient documentation

## 2021-11-10 DIAGNOSIS — S0990XA Unspecified injury of head, initial encounter: Secondary | ICD-10-CM | POA: Diagnosis present

## 2021-11-10 DIAGNOSIS — S065XAA Traumatic subdural hemorrhage with loss of consciousness status unknown, initial encounter: Secondary | ICD-10-CM | POA: Diagnosis present

## 2021-11-10 LAB — CBC WITH DIFFERENTIAL/PLATELET
Abs Immature Granulocytes: 0.04 10*3/uL (ref 0.00–0.07)
Basophils Absolute: 0 10*3/uL (ref 0.0–0.1)
Basophils Relative: 0 %
Eosinophils Absolute: 0.1 10*3/uL (ref 0.0–0.5)
Eosinophils Relative: 1 %
HCT: 44.6 % (ref 36.0–46.0)
Hemoglobin: 14.8 g/dL (ref 12.0–15.0)
Immature Granulocytes: 0 %
Lymphocytes Relative: 18 %
Lymphs Abs: 2.1 10*3/uL (ref 0.7–4.0)
MCH: 30 pg (ref 26.0–34.0)
MCHC: 33.2 g/dL (ref 30.0–36.0)
MCV: 90.5 fL (ref 80.0–100.0)
Monocytes Absolute: 0.9 10*3/uL (ref 0.1–1.0)
Monocytes Relative: 8 %
Neutro Abs: 8.8 10*3/uL — ABNORMAL HIGH (ref 1.7–7.7)
Neutrophils Relative %: 73 %
Platelets: 280 10*3/uL (ref 150–400)
RBC: 4.93 MIL/uL (ref 3.87–5.11)
RDW: 13.1 % (ref 11.5–15.5)
WBC: 11.9 10*3/uL — ABNORMAL HIGH (ref 4.0–10.5)
nRBC: 0 % (ref 0.0–0.2)

## 2021-11-10 LAB — BASIC METABOLIC PANEL
Anion gap: 8 (ref 5–15)
BUN: 12 mg/dL (ref 6–20)
CO2: 24 mmol/L (ref 22–32)
Calcium: 9.2 mg/dL (ref 8.9–10.3)
Chloride: 108 mmol/L (ref 98–111)
Creatinine, Ser: 0.72 mg/dL (ref 0.44–1.00)
GFR, Estimated: >60 mL/min (ref >60)
Glucose, Bld: 113 mg/dL — ABNORMAL HIGH (ref 70–99)
Potassium: 3.5 mmol/L (ref 3.5–5.1)
Sodium: 140 mmol/L (ref 135–145)

## 2021-11-10 LAB — PROTIME-INR
INR: 1.1 (ref 0.8–1.2)
Prothrombin Time: 13.6 seconds (ref 11.4–15.2)

## 2021-11-10 LAB — TYPE AND SCREEN
ABO/RH(D): O POS
Antibody Screen: NEGATIVE

## 2021-11-10 LAB — HEPATIC FUNCTION PANEL
ALT: 11 U/L (ref 0–44)
AST: 24 U/L (ref 15–41)
Albumin: 3.7 g/dL (ref 3.5–5.0)
Alkaline Phosphatase: 59 U/L (ref 38–126)
Bilirubin, Direct: 0.2 mg/dL (ref 0.0–0.2)
Indirect Bilirubin: 0.3 mg/dL (ref 0.3–0.9)
Total Bilirubin: 0.5 mg/dL (ref 0.3–1.2)
Total Protein: 7.1 g/dL (ref 6.5–8.1)

## 2021-11-10 LAB — I-STAT CHEM 8, ED
BUN: 15 mg/dL (ref 6–20)
Calcium, Ion: 1.13 mmol/L — ABNORMAL LOW (ref 1.15–1.40)
Chloride: 106 mmol/L (ref 98–111)
Creatinine, Ser: 0.6 mg/dL (ref 0.44–1.00)
Glucose, Bld: 106 mg/dL — ABNORMAL HIGH (ref 70–99)
HCT: 46 % (ref 36.0–46.0)
Hemoglobin: 15.6 g/dL — ABNORMAL HIGH (ref 12.0–15.0)
Potassium: 4.1 mmol/L (ref 3.5–5.1)
Sodium: 142 mmol/L (ref 135–145)
TCO2: 28 mmol/L (ref 22–32)

## 2021-11-10 MED ORDER — ONDANSETRON HCL 4 MG PO TABS: 4.0000 mg | ORAL_TABLET | Freq: Four times a day (QID) | ORAL | Status: DC | PRN

## 2021-11-10 MED ORDER — SODIUM CHLORIDE 0.9 % IV SOLN: 250.0000 mL | INTRAVENOUS | Status: DC | PRN

## 2021-11-10 MED ORDER — ACETAMINOPHEN 500 MG PO TABS
1000.0000 mg | ORAL_TABLET | Freq: Once | ORAL | Status: AC
  Administered 2021-11-10: 1000 mg via ORAL
  Filled 2021-11-10: qty 2

## 2021-11-10 MED ORDER — SODIUM CHLORIDE 0.9% FLUSH
3.0000 mL | Freq: Two times a day (BID) | INTRAVENOUS | Status: DC
  Administered 2021-11-11: 3 mL via INTRAVENOUS

## 2021-11-10 MED ORDER — SODIUM CHLORIDE 0.9 % IV SOLN: INTRAVENOUS | Status: DC | PRN

## 2021-11-10 MED ORDER — SODIUM CHLORIDE 0.9 % IV SOLN
12.5000 mg | Freq: Four times a day (QID) | INTRAVENOUS | Status: DC | PRN
  Administered 2021-11-10: 12.5 mg via INTRAVENOUS
  Filled 2021-11-10: qty 0.5

## 2021-11-10 MED ORDER — ONDANSETRON HCL 4 MG/2ML IJ SOLN
4.0000 mg | Freq: Four times a day (QID) | INTRAMUSCULAR | Status: DC | PRN
  Administered 2021-11-11 – 2021-11-12 (×4): 4 mg via INTRAVENOUS
  Filled 2021-11-10 (×4): qty 2

## 2021-11-10 MED ORDER — IOHEXOL 300 MG/ML  SOLN
80.0000 mL | Freq: Once | INTRAMUSCULAR | Status: AC | PRN
  Administered 2021-11-10: 80 mL via INTRAVENOUS

## 2021-11-10 MED ORDER — FENTANYL CITRATE PF 50 MCG/ML IJ SOSY
50.0000 ug | PREFILLED_SYRINGE | Freq: Once | INTRAMUSCULAR | Status: AC
  Administered 2021-11-10: 50 ug via INTRAVENOUS
  Filled 2021-11-10: qty 1

## 2021-11-10 MED ORDER — ACETAMINOPHEN 325 MG PO TABS
650.0000 mg | ORAL_TABLET | ORAL | Status: DC | PRN
  Administered 2021-11-11: 650 mg via ORAL
  Filled 2021-11-10: qty 2

## 2021-11-10 MED ORDER — SODIUM CHLORIDE 0.9% FLUSH: 3.0000 mL | INTRAVENOUS | Status: DC | PRN

## 2021-11-10 MED ORDER — HYDROCODONE-ACETAMINOPHEN 5-325 MG PO TABS
1.0000 | ORAL_TABLET | ORAL | Status: DC | PRN
  Filled 2021-11-10: qty 1

## 2021-11-10 MED ORDER — HYDROCODONE-ACETAMINOPHEN 5-325 MG PO TABS
2.0000 | ORAL_TABLET | ORAL | Status: DC | PRN
  Administered 2021-11-11 – 2021-11-12 (×3): 2 via ORAL
  Filled 2021-11-10 (×3): qty 2

## 2021-11-10 MED ORDER — ONDANSETRON HCL 4 MG/2ML IJ SOLN
4.0000 mg | Freq: Once | INTRAMUSCULAR | Status: AC
  Administered 2021-11-10: 4 mg via INTRAVENOUS
  Filled 2021-11-10: qty 2

## 2021-11-10 MED ORDER — LEVETIRACETAM 500 MG PO TABS
500.0000 mg | ORAL_TABLET | Freq: Two times a day (BID) | ORAL | Status: DC
  Administered 2021-11-10 – 2021-11-11 (×3): 500 mg via ORAL
  Filled 2021-11-10 (×4): qty 1
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
## 2021-11-11 ENCOUNTER — Other Ambulatory Visit: Payer: Self-pay

## 2021-11-11 ENCOUNTER — Observation Stay (HOSPITAL_COMMUNITY): Payer: Commercial Managed Care - HMO

## 2021-11-11 LAB — HIV ANTIBODY (ROUTINE TESTING W REFLEX): HIV Screen 4th Generation wRfx: NONREACTIVE
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
## 2021-11-12 ENCOUNTER — Observation Stay (HOSPITAL_COMMUNITY): Payer: Commercial Managed Care - HMO

## 2021-11-12 MED ORDER — LEVETIRACETAM 500 MG PO TABS
500.0000 mg | ORAL_TABLET | Freq: Two times a day (BID) | ORAL | 0 refills | Status: DC
Start: 2021-11-12 — End: 2021-11-28

## 2021-11-12 MED ORDER — ONDANSETRON HCL 4 MG PO TABS: 4.0000 mg | ORAL_TABLET | Freq: Three times a day (TID) | ORAL | 0 refills | Status: AC | PRN

## 2021-11-12 MED ORDER — HYDROCODONE-ACETAMINOPHEN 5-325 MG PO TABS: 1.0000 | ORAL_TABLET | Freq: Three times a day (TID) | ORAL | 0 refills | Status: AC | PRN

## 2021-11-12 NOTE — TOC Progression Note (Signed)
Transition of Care [facility]) - Progression Note    Patient Details  Name: Tracey Coleman MRN: [id number] Date of Birth: [birth year]  [facility]) CM/SW [place], RN Phone Number:[phone]  11/12/2021, 12:46 PM  Clinical Narrative:    Patient to follow up with neuro rehab for PT/OT verbal orders received . Unable to order shower chair due to insurance will not cover.        Expected Discharge Plan and Services           Expected Discharge Date: 11/12/21                                     Social Determinants of Health (SDOH) Interventions    Readmission Risk Interventions     No data to display
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
## 2021-11-23 ENCOUNTER — Ambulatory Visit (HOSPITAL_COMMUNITY)
Admission: EM | Admit: 2021-11-23 | Discharge: 2021-11-23 | Disposition: A | Discharge status: Home / Self Care | Payer: Commercial Managed Care - HMO

## 2021-11-23 ENCOUNTER — Emergency Department (HOSPITAL_COMMUNITY): Payer: Commercial Managed Care - HMO | Admitting: Certified Registered"

## 2021-11-23 ENCOUNTER — Emergency Department (HOSPITAL_COMMUNITY): Payer: Commercial Managed Care - HMO

## 2021-11-23 ENCOUNTER — Inpatient Hospital Stay (HOSPITAL_COMMUNITY)
Admission: EM | Admit: 2021-11-23 | Discharge: 2021-11-28 | DRG: 027 | Disposition: A | Discharge status: Home / Self Care | Payer: Commercial Managed Care - HMO | Attending: Neurosurgery | Admitting: Neurosurgery

## 2021-11-23 ENCOUNTER — Encounter (HOSPITAL_COMMUNITY)
Admission: EM | Disposition: A | Discharge status: Home / Self Care | Payer: Self-pay | Source: Home / Self Care | Attending: Neurosurgery

## 2021-11-23 ENCOUNTER — Other Ambulatory Visit: Payer: Self-pay

## 2021-11-23 ENCOUNTER — Encounter (HOSPITAL_COMMUNITY): Payer: Self-pay

## 2021-11-23 DIAGNOSIS — Z9889 Other specified postprocedural states: Secondary | ICD-10-CM

## 2021-11-23 DIAGNOSIS — Z881 Allergy status to other antibiotic agents status: Secondary | ICD-10-CM | POA: Diagnosis not present

## 2021-11-23 DIAGNOSIS — S065XAA Traumatic subdural hemorrhage with loss of consciousness status unknown, initial encounter: Principal | ICD-10-CM

## 2021-11-23 DIAGNOSIS — Z886 Allergy status to analgesic agent status: Secondary | ICD-10-CM

## 2021-11-23 DIAGNOSIS — Z885 Allergy status to narcotic agent status: Secondary | ICD-10-CM

## 2021-11-23 DIAGNOSIS — K219 Gastro-esophageal reflux disease without esophagitis: Secondary | ICD-10-CM | POA: Diagnosis present

## 2021-11-23 DIAGNOSIS — S065X0A Traumatic subdural hemorrhage without loss of consciousness, initial encounter: Secondary | ICD-10-CM | POA: Diagnosis present

## 2021-11-23 DIAGNOSIS — R519 Headache, unspecified: Secondary | ICD-10-CM

## 2021-11-23 HISTORY — PX: CRANIOTOMY: SHX93

## 2021-11-23 HISTORY — DX: Dyspnea, unspecified: R06.00

## 2021-11-23 LAB — BASIC METABOLIC PANEL
Anion gap: 11 (ref 5–15)
BUN: 7 mg/dL (ref 6–20)
CO2: 22 mmol/L (ref 22–32)
Calcium: 9.1 mg/dL (ref 8.9–10.3)
Chloride: 103 mmol/L (ref 98–111)
Creatinine, Ser: 0.61 mg/dL (ref 0.44–1.00)
GFR, Estimated: >60 mL/min (ref >60)
Glucose, Bld: 101 mg/dL — ABNORMAL HIGH (ref 70–99)
Potassium: 3.8 mmol/L (ref 3.5–5.1)
Sodium: 136 mmol/L (ref 135–145)

## 2021-11-23 LAB — CBC WITH DIFFERENTIAL/PLATELET
Abs Immature Granulocytes: 0.04 10*3/uL (ref 0.00–0.07)
Basophils Absolute: 0 10*3/uL (ref 0.0–0.1)
Basophils Relative: 0 %
Eosinophils Absolute: 0.1 10*3/uL (ref 0.0–0.5)
Eosinophils Relative: 1 %
HCT: 41.6 % (ref 36.0–46.0)
Hemoglobin: 14 g/dL (ref 12.0–15.0)
Immature Granulocytes: 0 %
Lymphocytes Relative: 20 %
Lymphs Abs: 2.3 10*3/uL (ref 0.7–4.0)
MCH: 30.2 pg (ref 26.0–34.0)
MCHC: 33.7 g/dL (ref 30.0–36.0)
MCV: 89.7 fL (ref 80.0–100.0)
Monocytes Absolute: 1.3 10*3/uL — ABNORMAL HIGH (ref 0.1–1.0)
Monocytes Relative: 11 %
Neutro Abs: 7.8 10*3/uL — ABNORMAL HIGH (ref 1.7–7.7)
Neutrophils Relative %: 68 %
Platelets: 366 10*3/uL (ref 150–400)
RBC: 4.64 MIL/uL (ref 3.87–5.11)
RDW: 12.7 % (ref 11.5–15.5)
WBC: 11.6 10*3/uL — ABNORMAL HIGH (ref 4.0–10.5)
nRBC: 0 % (ref 0.0–0.2)

## 2021-11-23 LAB — TYPE AND SCREEN
ABO/RH(D): O POS
Antibody Screen: NEGATIVE

## 2021-11-23 SURGERY — CRANIOTOMY HEMATOMA EVACUATION SUBDURAL
Anesthesia: General | Site: Head

## 2021-11-23 MED ORDER — SUCCINYLCHOLINE CHLORIDE 200 MG/10ML IV SOSY
PREFILLED_SYRINGE | INTRAVENOUS | Status: DC | PRN
  Administered 2021-11-23: 60 mg via INTRAVENOUS

## 2021-11-23 MED ORDER — CHLORHEXIDINE GLUCONATE 0.12 % MT SOLN
OROMUCOSAL | Status: AC
  Administered 2021-11-23: 15 mL via OROMUCOSAL
  Filled 2021-11-23: qty 15

## 2021-11-23 MED ORDER — MIDAZOLAM HCL 2 MG/2ML IJ SOLN
INTRAMUSCULAR | Status: AC
  Filled 2021-11-23: qty 2

## 2021-11-23 MED ORDER — AMISULPRIDE (ANTIEMETIC) 5 MG/2ML IV SOLN: 10.0000 mg | Freq: Once | INTRAVENOUS | Status: DC | PRN

## 2021-11-23 MED ORDER — LIDOCAINE-EPINEPHRINE 1 %-1:100000 IJ SOLN
INTRAMUSCULAR | Status: DC | PRN
  Administered 2021-11-23: 4 mL via INTRADERMAL

## 2021-11-23 MED ORDER — PHENYLEPHRINE HCL-NACL 20-0.9 MG/250ML-% IV SOLN
INTRAVENOUS | Status: DC | PRN
  Administered 2021-11-23: 30 ug/min via INTRAVENOUS

## 2021-11-23 MED ORDER — THROMBIN 5000 UNITS EX SOLR
OROMUCOSAL | Status: DC | PRN
  Administered 2021-11-23: 5 mL via TOPICAL

## 2021-11-23 MED ORDER — PROMETHAZINE HCL 25 MG PO TABS
12.5000 mg | ORAL_TABLET | ORAL | Status: DC | PRN
  Administered 2021-11-24 (×3): 25 mg via ORAL
  Filled 2021-11-23 (×3): qty 1

## 2021-11-23 MED ORDER — MORPHINE SULFATE (PF) 4 MG/ML IV SOLN
4.0000 mg | Freq: Once | INTRAVENOUS | Status: AC
  Administered 2021-11-23: 4 mg via INTRAVENOUS
  Filled 2021-11-23: qty 1

## 2021-11-23 MED ORDER — MIDAZOLAM HCL 2 MG/2ML IJ SOLN
INTRAMUSCULAR | Status: DC | PRN
  Administered 2021-11-23 (×2): 1 mg via INTRAVENOUS

## 2021-11-23 MED ORDER — PROPOFOL 10 MG/ML IV BOLUS
INTRAVENOUS | Status: DC | PRN
  Administered 2021-11-23: 150 mg via INTRAVENOUS

## 2021-11-23 MED ORDER — ONDANSETRON HCL 4 MG/2ML IJ SOLN
INTRAMUSCULAR | Status: AC
  Filled 2021-11-23: qty 2

## 2021-11-23 MED ORDER — FENTANYL CITRATE (PF) 100 MCG/2ML IJ SOLN
INTRAMUSCULAR | Status: AC
  Filled 2021-11-23: qty 2

## 2021-11-23 MED ORDER — PHENYLEPHRINE 80 MCG/ML (10ML) SYRINGE FOR IV PUSH (FOR BLOOD PRESSURE SUPPORT)
PREFILLED_SYRINGE | INTRAVENOUS | Status: AC
  Filled 2021-11-23: qty 10

## 2021-11-23 MED ORDER — PHENYLEPHRINE 80 MCG/ML (10ML) SYRINGE FOR IV PUSH (FOR BLOOD PRESSURE SUPPORT)
PREFILLED_SYRINGE | INTRAVENOUS | Status: DC | PRN
  Administered 2021-11-23 (×2): 80 ug via INTRAVENOUS

## 2021-11-23 MED ORDER — CHLORHEXIDINE GLUCONATE 0.12 % MT SOLN: 15.0000 mL | Freq: Once | OROMUCOSAL | Status: AC

## 2021-11-23 MED ORDER — THROMBIN 20000 UNITS EX SOLR
CUTANEOUS | Status: DC | PRN
  Administered 2021-11-23: 20 mL via TOPICAL

## 2021-11-23 MED ORDER — THROMBIN 20000 UNITS EX SOLR
CUTANEOUS | Status: AC
  Filled 2021-11-23: qty 20000

## 2021-11-23 MED ORDER — PROPOFOL 10 MG/ML IV BOLUS
INTRAVENOUS | Status: AC
  Filled 2021-11-23: qty 20

## 2021-11-23 MED ORDER — BACITRACIN ZINC 500 UNIT/GM EX OINT
TOPICAL_OINTMENT | CUTANEOUS | Status: DC | PRN
  Administered 2021-11-23: 1 via TOPICAL

## 2021-11-23 MED ORDER — LABETALOL HCL 5 MG/ML IV SOLN
10.0000 mg | INTRAVENOUS | Status: DC | PRN
  Administered 2021-11-23: 10 mg via INTRAVENOUS
  Administered 2021-11-24: 20 mg via INTRAVENOUS
  Filled 2021-11-23 (×2): qty 4
  Filled 2021-11-23: qty 8

## 2021-11-23 MED ORDER — LIDOCAINE 2% (20 MG/ML) 5 ML SYRINGE
INTRAMUSCULAR | Status: DC | PRN
  Administered 2021-11-23: 60 mg via INTRAVENOUS

## 2021-11-23 MED ORDER — CEFAZOLIN SODIUM-DEXTROSE 1-4 GM/50ML-% IV SOLN
1.0000 g | Freq: Three times a day (TID) | INTRAVENOUS | Status: AC
  Administered 2021-11-24 (×2): 1 g via INTRAVENOUS
  Filled 2021-11-23 (×2): qty 50

## 2021-11-23 MED ORDER — SUCCINYLCHOLINE CHLORIDE 200 MG/10ML IV SOSY
PREFILLED_SYRINGE | INTRAVENOUS | Status: AC
  Filled 2021-11-23: qty 10

## 2021-11-23 MED ORDER — HYDROCODONE-ACETAMINOPHEN 5-325 MG PO TABS
1.0000 | ORAL_TABLET | ORAL | Status: DC | PRN
  Administered 2021-11-24 (×3): 1 via ORAL
  Filled 2021-11-23 (×3): qty 1

## 2021-11-23 MED ORDER — DEXAMETHASONE SODIUM PHOSPHATE 10 MG/ML IJ SOLN
INTRAMUSCULAR | Status: AC
  Filled 2021-11-23: qty 1

## 2021-11-23 MED ORDER — SODIUM CHLORIDE 0.9 % IR SOLN
Status: DC | PRN
  Administered 2021-11-23: 3000 mL

## 2021-11-23 MED ORDER — FENTANYL CITRATE (PF) 250 MCG/5ML IJ SOLN
INTRAMUSCULAR | Status: AC
  Filled 2021-11-23: qty 5

## 2021-11-23 MED ORDER — SODIUM CHLORIDE 0.9 % IV SOLN: INTRAVENOUS | Status: DC

## 2021-11-23 MED ORDER — LACTATED RINGERS IV SOLN: INTRAVENOUS | Status: DC | PRN

## 2021-11-23 MED ORDER — PHENYLEPHRINE HCL (PRESSORS) 10 MG/ML IV SOLN
INTRAVENOUS | Status: AC
  Filled 2021-11-23: qty 1

## 2021-11-23 MED ORDER — LEVETIRACETAM 500 MG PO TABS
500.0000 mg | ORAL_TABLET | Freq: Two times a day (BID) | ORAL | Status: DC
  Filled 2021-11-23: qty 1

## 2021-11-23 MED ORDER — ORAL CARE MOUTH RINSE: 15.0000 mL | Freq: Once | OROMUCOSAL | Status: AC

## 2021-11-23 MED ORDER — ONDANSETRON HCL 4 MG PO TABS: 4.0000 mg | ORAL_TABLET | ORAL | Status: DC | PRN

## 2021-11-23 MED ORDER — FENTANYL CITRATE (PF) 250 MCG/5ML IJ SOLN
INTRAMUSCULAR | Status: DC | PRN
  Administered 2021-11-23: 150 ug via INTRAVENOUS

## 2021-11-23 MED ORDER — 0.9 % SODIUM CHLORIDE (POUR BTL) OPTIME
TOPICAL | Status: DC | PRN
  Administered 2021-11-23 (×2): 1000 mL

## 2021-11-23 MED ORDER — CEFAZOLIN SODIUM-DEXTROSE 2-3 GM-%(50ML) IV SOLR
INTRAVENOUS | Status: DC | PRN
  Administered 2021-11-23: 2 g via INTRAVENOUS

## 2021-11-23 MED ORDER — OXYCODONE HCL 5 MG/5ML PO SOLN: 5.0000 mg | Freq: Once | ORAL | Status: DC | PRN

## 2021-11-23 MED ORDER — MORPHINE SULFATE (PF) 2 MG/ML IV SOLN
2.0000 mg | INTRAVENOUS | Status: DC | PRN
  Administered 2021-11-23 – 2021-11-25 (×5): 2 mg via INTRAVENOUS
  Filled 2021-11-23 (×6): qty 1

## 2021-11-23 MED ORDER — PROMETHAZINE HCL 25 MG/ML IJ SOLN: 6.2500 mg | INTRAMUSCULAR | Status: DC | PRN

## 2021-11-23 MED ORDER — OXYCODONE HCL 5 MG PO TABS: 5.0000 mg | ORAL_TABLET | Freq: Once | ORAL | Status: DC | PRN

## 2021-11-23 MED ORDER — ROCURONIUM BROMIDE 10 MG/ML (PF) SYRINGE
PREFILLED_SYRINGE | INTRAVENOUS | Status: AC
  Filled 2021-11-23: qty 10

## 2021-11-23 MED ORDER — SUGAMMADEX SODIUM 200 MG/2ML IV SOLN
INTRAVENOUS | Status: DC | PRN
  Administered 2021-11-23 (×2): 100 mg via INTRAVENOUS

## 2021-11-23 MED ORDER — MICROFIBRILLAR COLL HEMOSTAT EX PADS
MEDICATED_PAD | CUTANEOUS | Status: DC | PRN
  Administered 2021-11-23: 1 via TOPICAL

## 2021-11-23 MED ORDER — ONDANSETRON HCL 4 MG/2ML IJ SOLN
INTRAMUSCULAR | Status: DC | PRN
  Administered 2021-11-23: 4 mg via INTRAVENOUS

## 2021-11-23 MED ORDER — LIDOCAINE-EPINEPHRINE 1 %-1:100000 IJ SOLN
INTRAMUSCULAR | Status: AC
  Filled 2021-11-23: qty 1

## 2021-11-23 MED ORDER — BUPIVACAINE HCL (PF) 0.5 % IJ SOLN
INTRAMUSCULAR | Status: AC
  Filled 2021-11-23: qty 30

## 2021-11-23 MED ORDER — LIDOCAINE 2% (20 MG/ML) 5 ML SYRINGE
INTRAMUSCULAR | Status: AC
  Filled 2021-11-23: qty 5

## 2021-11-23 MED ORDER — FENTANYL CITRATE (PF) 100 MCG/2ML IJ SOLN
25.0000 ug | INTRAMUSCULAR | Status: DC | PRN
  Administered 2021-11-23: 50 ug via INTRAVENOUS
  Administered 2021-11-23: 25 ug via INTRAVENOUS

## 2021-11-23 MED ORDER — DEXAMETHASONE SODIUM PHOSPHATE 10 MG/ML IJ SOLN
INTRAMUSCULAR | Status: DC | PRN
  Administered 2021-11-23: 8 mg via INTRAVENOUS

## 2021-11-23 MED ORDER — LEVETIRACETAM IN NACL 500 MG/100ML IV SOLN
500.0000 mg | Freq: Two times a day (BID) | INTRAVENOUS | Status: DC
  Administered 2021-11-23 – 2021-11-28 (×10): 500 mg via INTRAVENOUS
  Filled 2021-11-23 (×10): qty 100

## 2021-11-23 MED ORDER — ONDANSETRON HCL 4 MG/2ML IJ SOLN
4.0000 mg | INTRAMUSCULAR | Status: DC | PRN
  Administered 2021-11-23 – 2021-11-28 (×18): 4 mg via INTRAVENOUS
  Filled 2021-11-23 (×17): qty 2

## 2021-11-23 MED ORDER — THROMBIN 5000 UNITS EX SOLR
CUTANEOUS | Status: AC
  Filled 2021-11-23: qty 5000

## 2021-11-23 MED ORDER — ONDANSETRON HCL 4 MG/2ML IJ SOLN
4.0000 mg | Freq: Once | INTRAMUSCULAR | Status: AC
  Administered 2021-11-23: 4 mg via INTRAVENOUS
  Filled 2021-11-23: qty 2

## 2021-11-23 MED ORDER — ACETAMINOPHEN 10 MG/ML IV SOLN: 1000.0000 mg | Freq: Once | INTRAVENOUS | Status: DC | PRN

## 2021-11-23 MED ORDER — PANTOPRAZOLE SODIUM 40 MG IV SOLR
40.0000 mg | Freq: Every day | INTRAVENOUS | Status: DC
  Administered 2021-11-23 – 2021-11-26 (×4): 40 mg via INTRAVENOUS
  Filled 2021-11-23 (×4): qty 10

## 2021-11-23 MED ORDER — ROCURONIUM BROMIDE 10 MG/ML (PF) SYRINGE
PREFILLED_SYRINGE | INTRAVENOUS | Status: DC | PRN
  Administered 2021-11-23: 50 mg via INTRAVENOUS

## 2021-11-23 MED ORDER — BUPIVACAINE HCL (PF) 0.5 % IJ SOLN
INTRAMUSCULAR | Status: DC | PRN
  Administered 2021-11-23: 4 mL

## 2021-11-23 SURGICAL SUPPLY — 74 items
BAG COUNTER SPONGE SURGICOUNT (BAG) ×1 IMPLANT
BENZOIN TINCTURE PRP APPL 2/3 (GAUZE/BANDAGES/DRESSINGS) IMPLANT
BLADE CLIPPER SURG (BLADE) ×1 IMPLANT
BNDG GAUZE DERMACEA FLUFF 4 (GAUZE/BANDAGES/DRESSINGS) IMPLANT
BUR ACORN 6.0 PRECISION (BURR) ×1 IMPLANT
BUR MATCHSTICK NEURO 3.0 LAGG (BURR) IMPLANT
BUR SPIRAL ROUTER 2.3 (BUR) IMPLANT
CANISTER SUCT 3000ML PPV (MISCELLANEOUS) ×1 IMPLANT
CARTRIDGE OIL MAESTRO DRILL (MISCELLANEOUS) ×1 IMPLANT
CATH ROBINSON RED A/P 14FR (CATHETERS) IMPLANT
CLIP VESOCCLUDE MED 6/CT (CLIP) IMPLANT
COVER BURR HOLE 7 (Orthopedic Implant) IMPLANT
DIFFUSER DRILL AIR PNEUMATIC (MISCELLANEOUS) ×1 IMPLANT
DRAPE NEUROLOGICAL W/INCISE (DRAPES) ×1 IMPLANT
DRAPE SHEET LG 3/4 BI-LAMINATE (DRAPES) IMPLANT
DRAPE SURG 17X23 STRL (DRAPES) IMPLANT
DRAPE WARM FLUID 44X44 (DRAPES) ×1 IMPLANT
DRSG TELFA 3X8 NADH (GAUZE/BANDAGES/DRESSINGS) ×1 IMPLANT
DURAPREP 6ML APPLICATOR 50/CS (WOUND CARE) ×1 IMPLANT
ELECT REM PT RETURN 9FT ADLT (ELECTROSURGICAL) ×1
ELECTRODE REM PT RTRN 9FT ADLT (ELECTROSURGICAL) ×1 IMPLANT
EVACUATOR 1/8 PVC DRAIN (DRAIN) IMPLANT
EVACUATOR SILICONE 100CC (DRAIN) IMPLANT
GAUZE 4X4 16PLY ~~LOC~~+RFID DBL (SPONGE) IMPLANT
GAUZE SPONGE 4X4 12PLY STRL (GAUZE/BANDAGES/DRESSINGS) ×1 IMPLANT
GLOVE BIO SURGEON STRL SZ 6.5 (GLOVE) IMPLANT
GLOVE BIO SURGEON STRL SZ7 (GLOVE) IMPLANT
GLOVE BIO SURGEON STRL SZ7.5 (GLOVE) IMPLANT
GLOVE BIOGEL PI IND STRL 7.5 (GLOVE) ×2 IMPLANT
GLOVE ECLIPSE 6.5 STRL STRAW (GLOVE) IMPLANT
GLOVE ECLIPSE 7.0 STRL STRAW (GLOVE) ×2 IMPLANT
GLOVE EXAM NITRILE XL STR (GLOVE) IMPLANT
GLOVE INDICATOR 7.5 STRL GRN (GLOVE) IMPLANT
GOWN STRL REUS W/ TWL LRG LVL3 (GOWN DISPOSABLE) ×2 IMPLANT
GOWN STRL REUS W/ TWL XL LVL3 (GOWN DISPOSABLE) IMPLANT
GOWN STRL REUS W/TWL 2XL LVL3 (GOWN DISPOSABLE) IMPLANT
GOWN STRL REUS W/TWL LRG LVL3 (GOWN DISPOSABLE) ×4
GOWN STRL REUS W/TWL XL LVL3 (GOWN DISPOSABLE)
HEMOSTAT POWDER KIT SURGIFOAM (HEMOSTASIS) ×1 IMPLANT
HEMOSTAT SURGICEL 2X14 (HEMOSTASIS) IMPLANT
HOOK DURA 1/2IN (MISCELLANEOUS) ×1 IMPLANT
IV NS IRRIG 3000ML ARTHROMATIC (IV SOLUTION) IMPLANT
KIT BASIN OR (CUSTOM PROCEDURE TRAY) ×1 IMPLANT
KIT TURNOVER KIT B (KITS) ×1 IMPLANT
NEEDLE HYPO 22GX1.5 SAFETY (NEEDLE) ×1 IMPLANT
NS IRRIG 1000ML POUR BTL (IV SOLUTION) ×1 IMPLANT
OIL CARTRIDGE MAESTRO DRILL (MISCELLANEOUS) ×1
PACK BATTERY CMF DISP FOR DVR (ORTHOPEDIC DISPOSABLE SUPPLIES) IMPLANT
PACK CRANIOTOMY CUSTOM (CUSTOM PROCEDURE TRAY) ×1 IMPLANT
PAD DRESSING TELFA 3X8 NADH (GAUZE/BANDAGES/DRESSINGS) IMPLANT
PATTIES SURGICAL .5 X.5 (GAUZE/BANDAGES/DRESSINGS) IMPLANT
PATTIES SURGICAL .5 X3 (DISPOSABLE) IMPLANT
PATTIES SURGICAL 1X1 (DISPOSABLE) IMPLANT
PLATE UNIV CMF 16 2H (Plate) IMPLANT
SCREW UNIII AXS SD 1.5X4 (Screw) IMPLANT
SET CYSTO W/LG BORE CLAMP LF (SET/KITS/TRAYS/PACK) IMPLANT
SPONGE NEURO XRAY DETECT 1X3 (DISPOSABLE) IMPLANT
SPONGE SURGIFOAM ABS GEL 100 (HEMOSTASIS) ×1 IMPLANT
STAPLER VISISTAT 35W (STAPLE) ×1 IMPLANT
STOCKINETTE 6  STRL (DRAPES) ×1
STOCKINETTE 6 STRL (DRAPES) ×1 IMPLANT
SUT ETHILON 3 0 FSL (SUTURE) IMPLANT
SUT ETHILON 3 0 PS 1 (SUTURE) IMPLANT
SUT NURALON 4 0 TR CR/8 (SUTURE) ×3 IMPLANT
SUT VIC AB 0 CT1 18XCR BRD8 (SUTURE) ×2 IMPLANT
SUT VIC AB 0 CT1 8-18 (SUTURE) ×1
SUT VIC AB 3-0 SH 8-18 (SUTURE) ×2 IMPLANT
TAPE CLOTH 1X10 TAN NS (GAUZE/BANDAGES/DRESSINGS) ×1 IMPLANT
TOWEL GREEN STERILE (TOWEL DISPOSABLE) ×1 IMPLANT
TOWEL GREEN STERILE FF (TOWEL DISPOSABLE) ×1 IMPLANT
TRAY FOLEY MTR SLVR 16FR STAT (SET/KITS/TRAYS/PACK) ×1 IMPLANT
TUBE CONNECTING 12X1/4 (SUCTIONS) ×1 IMPLANT
UNDERPAD 30X36 HEAVY ABSORB (UNDERPADS AND DIAPERS) ×1 IMPLANT
WATER STERILE IRR 1000ML POUR (IV SOLUTION) ×1 IMPLANT

## 2021-11-23 NOTE — Anesthesia Preprocedure Evaluation (Addendum)
Anesthesia Evaluation  Patient identified by MRN, date of birth, ID band Patient unresponsive    Reviewed: Allergy & Precautions, NPO status , Patient's Chart, lab work & pertinent test results  Airway Mallampati: III       Dental no notable dental hx.    Pulmonary neg pulmonary ROS,    Pulmonary exam normal        Cardiovascular negative cardio ROS Normal cardiovascular exam     Neuro/Psych  Headaches, negative psych ROS   GI/Hepatic negative GI ROS, Neg liver ROS,   Endo/Other  negative endocrine ROS  Renal/GU negative Renal ROS     Musculoskeletal negative musculoskeletal ROS (+)   Abdominal   Peds  Hematology negative hematology ROS (+)   Anesthesia Other Findings Right subdural Hematoma  Reproductive/Obstetrics S/p hysterectomy                           Anesthesia Physical Anesthesia Plan  ASA: 4 and emergent  Anesthesia Plan: General   Post-op Pain Management:    Induction: Intravenous and Rapid sequence  PONV Risk Score and Plan: 3 and Ondansetron, Dexamethasone and Treatment may vary due to age or medical condition  Airway Management Planned: Oral ETT  Additional Equipment: Arterial line  Intra-op Plan:   Post-operative Plan: Possible Post-op intubation/ventilation  Informed Consent: I have reviewed the patients History and Physical, chart, labs and discussed the procedure including the risks, benefits and alternatives for the proposed anesthesia with the patient or authorized representative who has indicated his/her understanding and acceptance.     Consent reviewed with POA and Dental advisory given  Plan Discussed with: CRNA  Anesthesia Plan Comments:       Anesthesia Quick Evaluation

## 2021-11-23 NOTE — Anesthesia Procedure Notes (Signed)
Arterial Line Insertion Start/End9/11/2021 6:48 PM, 11/23/2021 6:56 PM Performed by: Sammie Bench, CRNA, CRNA  Patient location: Pre-op. Preanesthetic checklist: patient identified, IV checked, site marked, risks and benefits discussed, surgical consent, monitors and equipment checked, pre-op evaluation, timeout performed and anesthesia consent Lidocaine 1% used for infiltration radial was placed Catheter size: 20 G Hand hygiene performed , maximum sterile barriers used  and Seldinger technique used Allen's test indicative of satisfactory collateral circulation Attempts: 1 Procedure performed without using ultrasound guided technique. Following insertion, dressing applied and Biopatch. Post procedure assessment: normal and unchanged
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
## 2021-11-23 NOTE — Anesthesia Procedure Notes (Signed)
Procedure Name: Intubation Date/Time: 11/23/2021 7:34 PM  Performed by: Sammie Bench, CRNAPre-anesthesia Checklist: Patient identified, Emergency Drugs available, Suction available and Patient being monitored Patient Re-evaluated:Patient Re-evaluated prior to induction Oxygen Delivery Method: Circle System Utilized Preoxygenation: Pre-oxygenation with 100% oxygen Induction Type: IV induction, Rapid sequence and Cricoid Pressure applied Ventilation: Mask ventilation without difficulty Laryngoscope Size: Mac and 3 Grade View: Grade II Tube type: Oral Tube size: 7.0 mm Number of attempts: 1 Airway Equipment and Method: Stylet and Oral airway Placement Confirmation: ETT inserted through vocal cords under direct vision, positive ETCO2 and breath sounds checked- equal and bilateral Secured at: 21 cm Tube secured with: Tape Dental Injury: Teeth and Oropharynx as per pre-operative assessment

## 2021-11-23 NOTE — Progress Notes (Signed)
Pacu RN Report to floor given  Gave report to  [employer]. Room: 4N27    Discussed surgery, meds given in OR and Pacu, VS, IV fluids given, EBL, urine output, pain and other pertinent information. Also discussed if pt had any family or friends here or belongings with them.   Pupils equal/reactive, size 86m, VSS, aline is approx 15-20 points higher than cuff Bp, moving all extremities, no deficits, AXOx4, sleepy off and on, easily arousable, pain was treated with Fentanyl as documented. Will update family with a text.   Pt exits my care.
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
## 2021-11-23 NOTE — Anesthesia Postprocedure Evaluation (Signed)
Anesthesia Post Note  Patient: Furniture conservator/restorer  Procedure(s) Performed: CRANIOTOMY HEMATOMA EVACUATION SUBDURAL (Head)     Patient location during evaluation: PACU Anesthesia Type: General Level of consciousness: awake Pain management: pain level controlled Vital Signs Assessment: post-procedure vital signs reviewed and stable Respiratory status: spontaneous breathing, nonlabored ventilation, respiratory function stable and patient connected to nasal cannula oxygen Cardiovascular status: blood pressure returned to baseline and stable Postop Assessment: no apparent nausea or vomiting Anesthetic complications: no   No notable events documented.  Last Vitals:  Vitals:   11/23/21 2245 11/23/21 2300  BP: (!) 137/94 (!) 142/98  Pulse: 75 78  Resp: 14 16  Temp:    SpO2: 96% 97%    Last Pain:  Vitals:   11/23/21 2230  TempSrc: Oral  PainSc: 8                  Yvette Roark P Whit Bruni
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
## 2021-11-23 NOTE — Transfer of Care (Signed)
Immediate Anesthesia Transfer of Care Note  Patient: Tracey Coleman  Procedure(s) Performed: CRANIOTOMY HEMATOMA EVACUATION SUBDURAL (Head)  Patient Location: PACU  Anesthesia Type:General  Level of Consciousness: drowsy  Airway & Oxygen Therapy: Patient Spontanous Breathing and Patient connected to nasal cannula oxygen  Post-op Assessment: Report given to RN and Post -op Vital signs reviewed and stable  Post vital signs: Reviewed and stable  Last Vitals:  Vitals Value Taken Time  BP 131/95 11/23/21 2106  Temp    Pulse 95 11/23/21 2110  Resp 25 11/23/21 2110  SpO2 100 % 11/23/21 2110  Vitals shown include unvalidated device data.  Last Pain:  Vitals:   11/23/21 1753  TempSrc: Oral  PainSc:          Complications: No notable events documented.
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
## 2021-11-23 NOTE — Discharge Instructions (Signed)
D/C to ED via [facility] 

## 2021-11-24 ENCOUNTER — Encounter (HOSPITAL_COMMUNITY): Payer: Self-pay | Admitting: Neurosurgery

## 2021-11-24 ENCOUNTER — Inpatient Hospital Stay (HOSPITAL_COMMUNITY): Payer: Commercial Managed Care - HMO

## 2021-11-24 LAB — SURGICAL PCR SCREEN
MRSA, PCR: NEGATIVE
Staphylococcus aureus: NEGATIVE

## 2021-11-24 MED ORDER — CHLORHEXIDINE GLUCONATE CLOTH 2 % EX PADS
6.0000 | MEDICATED_PAD | Freq: Once | CUTANEOUS | Status: AC
  Administered 2021-11-24: 6 via TOPICAL

## 2021-11-24 MED ORDER — ORAL CARE MOUTH RINSE: 15.0000 mL | OROMUCOSAL | Status: DC | PRN

## 2021-11-24 MED ORDER — LIDOCAINE 5 % EX PTCH
1.0000 | MEDICATED_PATCH | CUTANEOUS | Status: DC
  Administered 2021-11-24 – 2021-11-27 (×4): 1 via TRANSDERMAL
  Filled 2021-11-24 (×4): qty 1

## 2021-11-24 MED ORDER — INFLUENZA VAC SPLIT QUAD 0.5 ML IM SUSY: 0.5000 mL | PREFILLED_SYRINGE | INTRAMUSCULAR | Status: DC | PRN

## 2021-11-24 MED ORDER — CHLORHEXIDINE GLUCONATE CLOTH 2 % EX PADS
6.0000 | MEDICATED_PAD | Freq: Every morning | CUTANEOUS | Status: DC
Start: 2021-11-24 — End: 2021-11-28
  Administered 2021-11-26: 6 via TOPICAL

## 2021-11-24 NOTE — Progress Notes (Signed)
  NEUROSURGERY PROGRESS NOTE   Pt seen and examined. No issues overnight. Cont to c/o HA.  EXAM: Temp:  [97.9 F (36.6 C)-99 F (37.2 C)] 99 F (37.2 C) (09/10 0800) Pulse Rate:  [66-100] 66 (09/10 0800) Resp:  [12-25] 19 (09/10 0800) BP: (109-156)/(77-99) 109/80 (09/10 0800) SpO2:  [96 %-100 %] 97 % (09/10 0800) Arterial Line BP: (126-162)/(72-105) 129/74 (09/10 0800) Weight:  [59.4 kg-61.2 kg] 59.4 kg (09/10 0300) Intake/Output      09/09 0701 09/10 0700 09/10 0701 09/11 0700   I.V. (mL/kg) 1506.7 (25.4)    IV Piggyback 160    Total Intake(mL/kg) 1666.7 (28.1)    Urine (mL/kg/hr) 1125 155 (0.8)   Emesis/NG output 0    Blood 100    Total Output 1225 155   Net +441.7 -155        Emesis Occurrence 2 x     Awake, alert, oriented Speech fluent CN intact MAE well Wound c/d/i  LABS: Lab Results  Component Value Date   CREATININE 0.61 11/23/2021   BUN 7 11/23/2021   NA 136 11/23/2021   K 3.8 11/23/2021   CL 103 11/23/2021   CO2 22 11/23/2021   Lab Results  Component Value Date   WBC 11.6 (H) 11/23/2021   HGB 14.0 11/23/2021   HCT 41.6 11/23/2021   MCV 89.7 11/23/2021   PLT 366 11/23/2021    IMAGING: CTH reviewed, no adverse features. Minor residual blood products subjacent to crani flap. No HCP.  IMPRESSION: - 46 y.o. female POD#1 s/p right crani for acute SDH, at baseline  PLAN: - Cont to monitor in ICU - Can begin to mobilize Real, MD [facility]

## 2021-11-24 NOTE — Plan of Care (Signed)
  Problem: Education: Goal: Knowledge of General Education information will improve Description: Including pain rating scale, medication(s)/side effects and non-pharmacologic comfort measures Outcome: Progressing   Problem: Health Behavior/Discharge Planning: Goal: Ability to manage health-related needs will improve Outcome: Progressing   Problem: Clinical Measurements: Goal: Ability to maintain clinical measurements within normal limits will improve Outcome: Progressing   Problem: Activity: Goal: Risk for activity intolerance will decrease Outcome: Progressing   Problem: Nutrition: Goal: Adequate nutrition will be maintained Outcome: Progressing   Problem: Coping: Goal: Level of anxiety will decrease Outcome: Progressing   Problem: Pain Managment: Goal: General experience of comfort will improve Outcome: Progressing   Problem: Skin Integrity: Goal: Risk for impaired skin integrity will decrease Outcome: Progressing   

## 2021-11-24 NOTE — Progress Notes (Signed)
Pt noted to have new drainage at surgical dressing site after movement when pt arrived to the unit. Dr. Kathyrn Sheriff informed. MD communicated that was normal. Pt noted to have more drainage at this time after movement to from pt bed to CT bed and back. Pt still Aox4, able to follow commands.

## 2021-11-24 NOTE — Progress Notes (Signed)
Pt noted to have n/v which pt received prn anti-emetic.

## 2021-11-25 ENCOUNTER — Encounter (HOSPITAL_COMMUNITY): Payer: Self-pay | Admitting: Neurosurgery

## 2021-11-25 LAB — CBC
HCT: 35.2 % — ABNORMAL LOW (ref 36.0–46.0)
Hemoglobin: 11.6 g/dL — ABNORMAL LOW (ref 12.0–15.0)
MCH: 29.7 pg (ref 26.0–34.0)
MCHC: 33 g/dL (ref 30.0–36.0)
MCV: 90.3 fL (ref 80.0–100.0)
Platelets: 323 10*3/uL (ref 150–400)
RBC: 3.9 MIL/uL (ref 3.87–5.11)
RDW: 12.8 % (ref 11.5–15.5)
WBC: 13.6 10*3/uL — ABNORMAL HIGH (ref 4.0–10.5)
nRBC: 0 % (ref 0.0–0.2)

## 2021-11-25 LAB — BASIC METABOLIC PANEL
Anion gap: 8 (ref 5–15)
BUN: 7 mg/dL (ref 6–20)
CO2: 22 mmol/L (ref 22–32)
Calcium: 8.3 mg/dL — ABNORMAL LOW (ref 8.9–10.3)
Chloride: 105 mmol/L (ref 98–111)
Creatinine, Ser: 0.53 mg/dL (ref 0.44–1.00)
GFR, Estimated: >60 mL/min (ref >60)
Glucose, Bld: 106 mg/dL — ABNORMAL HIGH (ref 70–99)
Potassium: 4 mmol/L (ref 3.5–5.1)
Sodium: 135 mmol/L (ref 135–145)

## 2021-11-25 MED ORDER — ACETAMINOPHEN 325 MG PO TABS
650.0000 mg | ORAL_TABLET | Freq: Four times a day (QID) | ORAL | Status: DC | PRN
  Administered 2021-11-25 – 2021-11-26 (×2): 650 mg via ORAL
  Filled 2021-11-25 (×2): qty 2

## 2021-11-25 MED ORDER — MENTHOL 3 MG MT LOZG
1.0000 | LOZENGE | OROMUCOSAL | Status: DC | PRN
  Administered 2021-11-25: 3 mg via ORAL
  Filled 2021-11-25: qty 9

## 2021-11-25 MED ORDER — TRAMADOL HCL 50 MG PO TABS
50.0000 mg | ORAL_TABLET | Freq: Four times a day (QID) | ORAL | Status: DC | PRN
  Administered 2021-11-25 – 2021-11-26 (×2): 50 mg via ORAL
  Filled 2021-11-25 (×2): qty 1

## 2021-11-25 MED ORDER — MORPHINE SULFATE (PF) 2 MG/ML IV SOLN
2.0000 mg | INTRAVENOUS | Status: DC | PRN
  Administered 2021-11-25 – 2021-11-28 (×13): 2 mg via INTRAVENOUS
  Filled 2021-11-25 (×13): qty 1

## 2021-11-25 NOTE — Progress Notes (Signed)
  Transition of Care (TOC) Screening Note   Patient Details  Name: Tracey Coleman Date of Birth: 05/04/1975   Transition of Care [facility]) CM/SW Contact:    Benard Halsted, LCSW Phone Number: 11/25/2021, 10:02 AM    Transition of Care Department [facility]) has reviewed patient and no TOC needs have been identified at this time. We will continue to monitor patient advancement through interdisciplinary progression rounds. If new patient transition needs arise, please place a TOC consult.

## 2021-11-25 NOTE — Progress Notes (Addendum)
  NEUROSURGERY PROGRESS NOTE   Pt seen and examined. Cont to c/o HA. Says abd pain related to vomiting. Has had bleeding from incision  EXAM: Temp:  [98.1 F (36.7 C)-99.4 F (37.4 C)] 98.5 F (36.9 C) (09/11 0800) Pulse Rate:  [62-87] 75 (09/11 0900) Resp:  [12-28] 18 (09/11 0900) BP: (98-135)/(72-89) 130/88 (09/11 0900) SpO2:  [95 %-99 %] 97 % (09/11 0900) Arterial Line BP: (112-179)/(64-100) 143/82 (09/11 0900) Intake/Output      09/10 0701 09/11 0700 09/11 0701 09/12 0700   P.O. 240    I.V. (mL/kg) 1689.8 (28.4) 150.1 (2.5)   IV Piggyback 200    Total Intake(mL/kg) 2129.8 (35.9) 150.1 (2.5)   Urine (mL/kg/hr) 2335 (1.6)    Emesis/NG output 1    Blood     Total Output 2336    Net -206.2 +150.1         Drowsy but arouses oriented CN intact MAE well Wound does have clotted blood around the incision, no obvious subcutaneous hematoma. No active incisional bleeding identified.  LABS: Lab Results  Component Value Date   CREATININE 0.53 11/25/2021   BUN 7 11/25/2021   NA 135 11/25/2021   K 4.0 11/25/2021   CL 105 11/25/2021   CO2 22 11/25/2021   Lab Results  Component Value Date   WBC 13.6 (H) 11/25/2021   HGB 11.6 (L) 11/25/2021   HCT 35.2 (L) 11/25/2021   MCV 90.3 11/25/2021   PLT 323 11/25/2021   IMAGING: Chest and Abd X-ray negative  IMPRESSION: - 46 y.o. female POD#2 s/p right crani for acute SDH, neurologically intact, cont to have HA and N/V  PLAN: - Cont to monitor in ICU - Can begin to mobilize OOB - Cont Keppra - Change hydrocodone to ultram   Tracey Lose, MD [facility]

## 2021-11-25 NOTE — Progress Notes (Signed)
Patient's surgical site continues to bleed a fair amount from one small sight. I have redressed it every 1-2 hours today with gauze.   I just redressed it again with gauze and kerlix wrap to apply a bit of pressure. Dr Kathyrn Sheriff was notified and was also aware of bleeding from earlier assessment of the patient. He will likely add another staple tomorrow if needed. Theran Vandergrift C 5:22 PM

## 2021-11-25 NOTE — Progress Notes (Signed)
Shift summary. Continued small amount of bleeding from surgical site throughout the shift, bandage was reinforced once. Blood localized to bandage, not running down scalp. GCS 15, RASS -1, Modified stroke scale 0-2 due to intermittent drift of left leg and decreased facial sensation. Patient received 3 doses of morphine and 2 doses of zofran. Pt reports nausea is significantly less than yesterday. Family request Norco not be given and feels it makes nausea worse. Urine output 550 mL in foley. Heart rate NSR in 70s with SBP 120-130. One dose of labetalol given due to elevated blood pressure during patient bath. Tmax 99.4. Husband at bedside, calm and supportive. Erling Conte, RN

## 2021-11-26 LAB — HEMOGLOBIN AND HEMATOCRIT, BLOOD
HCT: 38.3 % (ref 36.0–46.0)
Hemoglobin: 13.3 g/dL (ref 12.0–15.0)

## 2021-11-26 NOTE — Progress Notes (Signed)
  NEUROSURGERY PROGRESS NOTE   No issues overnight. Pt cont to c/o HA and n/v.  EXAM:  BP 109/79 (BP Location: Left Arm)   Pulse 74   Temp 98.1 F (36.7 C) (Axillary)   Resp 14   Ht 5' 4.02" (1.626 m)   Wt 59.4 kg   LMP 09/15/2016 (Within Days)   SpO2 96%   BMI 22.47 kg/m   Drowsy but easily arouses  Speech fluent, appropriate  CN grossly intact  MAE well No active bleeding from wound noted, but hematoma is seen around the incision and on previous dressing.  IMPRESSION:  46 y.o. female POD# 3 s/p right crani for SDH, neurologically stable but recovering very slowly Oozing from incision  PLAN: - Will place quick-clot over wound and replace turban dressing - Can transfer to stepdown    Consuella Lose, MD [facility]

## 2021-11-27 MED ORDER — PANTOPRAZOLE SODIUM 40 MG PO TBEC
40.0000 mg | DELAYED_RELEASE_TABLET | Freq: Every day | ORAL | Status: DC
  Administered 2021-11-27: 40 mg via ORAL
  Filled 2021-11-27: qty 1

## 2021-11-27 MED ORDER — ENSURE ENLIVE PO LIQD
237.0000 mL | Freq: Two times a day (BID) | ORAL | Status: DC
  Administered 2021-11-27 – 2021-11-28 (×2): 237 mL via ORAL

## 2021-11-28 ENCOUNTER — Inpatient Hospital Stay (HOSPITAL_COMMUNITY): Payer: Commercial Managed Care - HMO

## 2021-11-28 ENCOUNTER — Ambulatory Visit: Payer: Commercial Managed Care - HMO | Admitting: Physical Therapy

## 2021-11-28 ENCOUNTER — Encounter: Payer: Commercial Managed Care - HMO | Admitting: Occupational Therapy

## 2021-11-28 MED ORDER — ONDANSETRON HCL 4 MG PO TABS
4.0000 mg | ORAL_TABLET | Freq: Three times a day (TID) | ORAL | 0 refills | Status: AC | PRN
Start: 2021-11-28 — End: ?

## 2021-11-28 MED ORDER — TRAMADOL HCL 50 MG PO TABS: 50.0000 mg | ORAL_TABLET | Freq: Four times a day (QID) | ORAL | 0 refills | Status: AC | PRN
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
## 2021-11-28 NOTE — Progress Notes (Signed)
  NEUROSURGERY PROGRESS NOTE   No issues overnight. Pt cont to c/o HA, no n/v this am.   EXAM:  BP (!) 113/91 (BP Location: Right Arm)   Pulse 72   Temp 98.5 F (36.9 C) (Oral)   Resp 16   Ht 5' 4.02" (1.626 m)   Wt 59.4 kg   LMP 09/15/2016 (Within Days)   SpO2 97%   BMI 22.47 kg/m   Drowsy but easily arouses  Speech fluent, appropriate  CN grossly intact  MAE well Headwrap in place, no staining  IMPRESSION:  46 y.o. female POD# 4 s/p right crani for SDH, neurologically stable but recovering very slowly  PLAN: - Cont supportive care    Consuella Lose, MD [facility]

## 2021-11-28 NOTE — Progress Notes (Signed)
  NEUROSURGERY PROGRESS NOTE   No issues overnight. Pt cont to c/o HA, no n/v this am. Overall reports improvement. Feels ready to go home.  EXAM:  BP (!) 113/91 (BP Location: Right Arm)   Pulse 72   Temp 98.5 F (36.9 C) (Oral)   Resp 16   Ht 5' 4.02" (1.626 m)   Wt 59.4 kg   LMP 09/15/2016 (Within Days)   SpO2 97%   BMI 22.47 kg/m   Drowsy but easily arouses  Speech fluent, appropriate  CN grossly intact  MAE well Headwrap in place, no staining  IMPRESSION:  46 y.o. female POD# 5 s/p right crani for SDH, neurologically stable but recovering very slowly  PLAN: - Cont supportive care - Will get repeat CTH this am. If no worrisome findings, can likely d/c home later today    Consuella Lose, MD [facility]

## 2021-11-28 NOTE — Progress Notes (Signed)
El Brazil personally reviewed, no significant change in size of SDH/MLS. No HCP. Will plan on d/c home with outpatient f/u and repeat CTH in 3 weeks.  Consuella Lose, MD [facility]

## 2021-12-06 ENCOUNTER — Other Ambulatory Visit: Payer: Self-pay

## 2021-12-06 ENCOUNTER — Other Ambulatory Visit: Payer: Self-pay | Admitting: General Surgery

## 2021-12-06 DIAGNOSIS — N6310 Unspecified lump in the right breast, unspecified quadrant: Secondary | ICD-10-CM

## 2021-12-11 ENCOUNTER — Other Ambulatory Visit: Payer: Self-pay | Admitting: Obstetrics and Gynecology

## 2021-12-11 DIAGNOSIS — N6310 Unspecified lump in the right breast, unspecified quadrant: Secondary | ICD-10-CM

## 2021-12-18 ENCOUNTER — Ambulatory Visit
Admission: RE | Admit: 2021-12-18 | Discharge: 2021-12-18 | Disposition: A | Discharge status: Home / Self Care | Payer: Commercial Managed Care - HMO | Source: Ambulatory Visit | Attending: Obstetrics and Gynecology | Admitting: Obstetrics and Gynecology

## 2021-12-18 ENCOUNTER — Other Ambulatory Visit: Payer: Self-pay | Admitting: Obstetrics and Gynecology

## 2021-12-18 DIAGNOSIS — N6310 Unspecified lump in the right breast, unspecified quadrant: Secondary | ICD-10-CM

## 2021-12-30 ENCOUNTER — Other Ambulatory Visit: Payer: Commercial Managed Care - HMO

## 2022-01-13 ENCOUNTER — Ambulatory Visit
Admission: RE | Admit: 2022-01-13 | Discharge: 2022-01-13 | Disposition: A | Discharge status: Home / Self Care | Payer: Commercial Managed Care - HMO | Source: Ambulatory Visit | Attending: Obstetrics and Gynecology | Admitting: Obstetrics and Gynecology

## 2022-01-13 DIAGNOSIS — N6310 Unspecified lump in the right breast, unspecified quadrant: Secondary | ICD-10-CM

## 2022-04-14 ENCOUNTER — Other Ambulatory Visit: Payer: Self-pay | Admitting: Obstetrics and Gynecology

## 2022-04-14 DIAGNOSIS — N6320 Unspecified lump in the left breast, unspecified quadrant: Secondary | ICD-10-CM

## 2022-04-17 ENCOUNTER — Other Ambulatory Visit: Payer: Commercial Managed Care - HMO

## 2022-05-23 IMAGING — MG MM DIGITAL DIAGNOSTIC UNILAT*L* IMPLANT W/ TOMO W/ CAD
8 series · 8 of 20 positions shown · non-contrast
Comparison: Previous exam(s).

CLINICAL DATA: 45-year-old female presenting with a new lump in the
left breast.

EXAM:
DIGITAL DIAGNOSTIC UNILATERAL LEFT MAMMOGRAM WITH IMPLANTS, CAD AND
TOMOSYNTHESIS; ULTRASOUND LEFT BREAST LIMITED
TECHNIQUE: Left digital diagnostic mammography and breast tomosynthesis was
performed. The images were evaluated with computer-aided detection.
Standard and/or implant displaced views were performed.; Targeted
ultrasound examination of the left breast was performed.

[L CC]
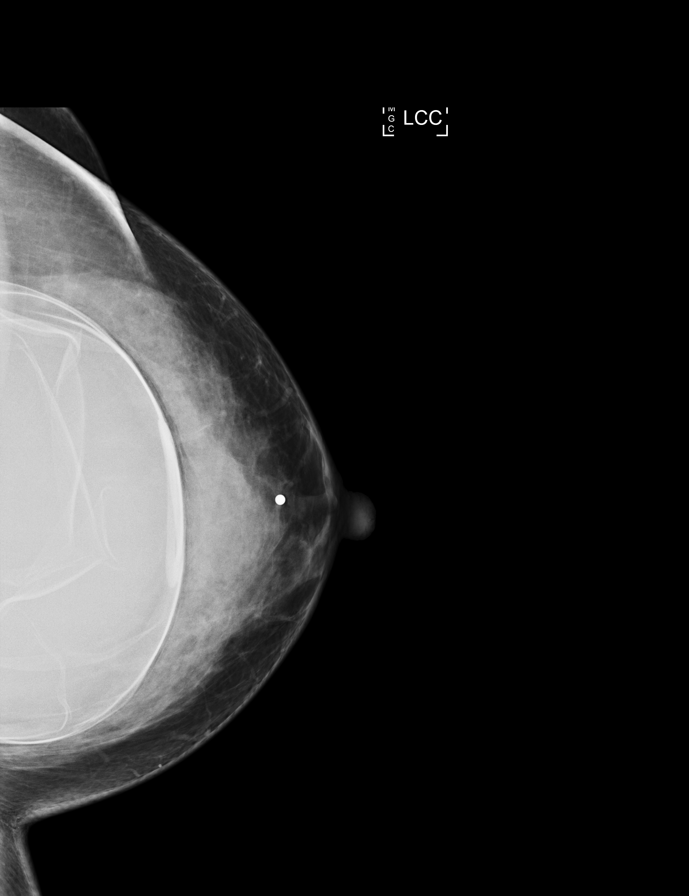

[L MLO]
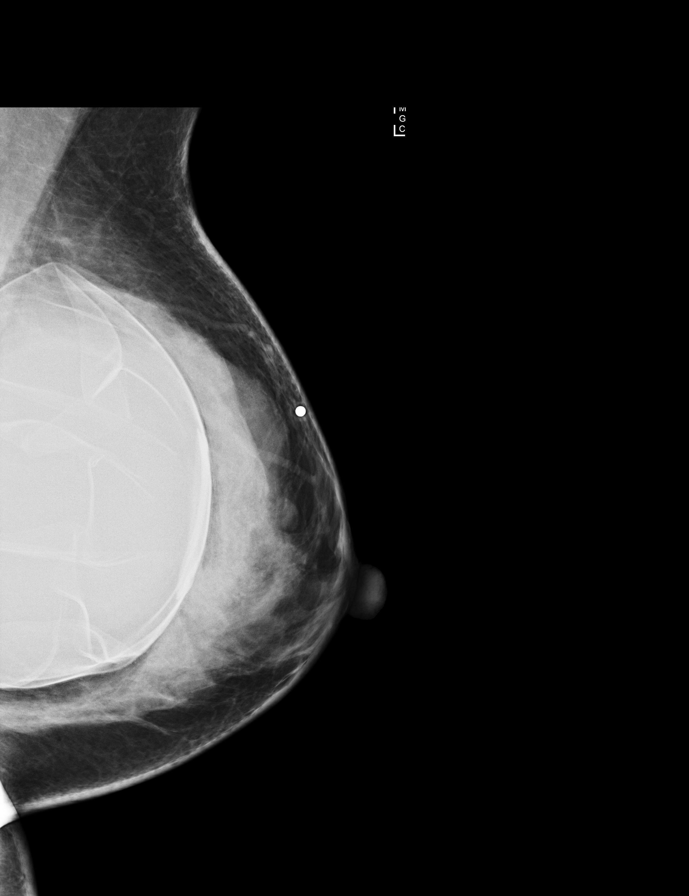

[L MLO synth-2D]
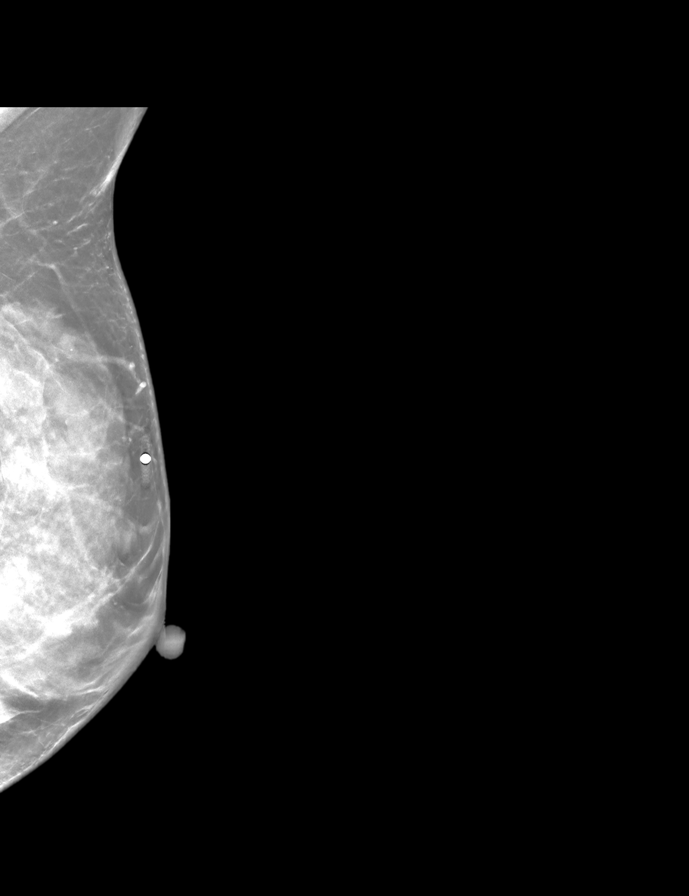

[L TAN synth-2D]
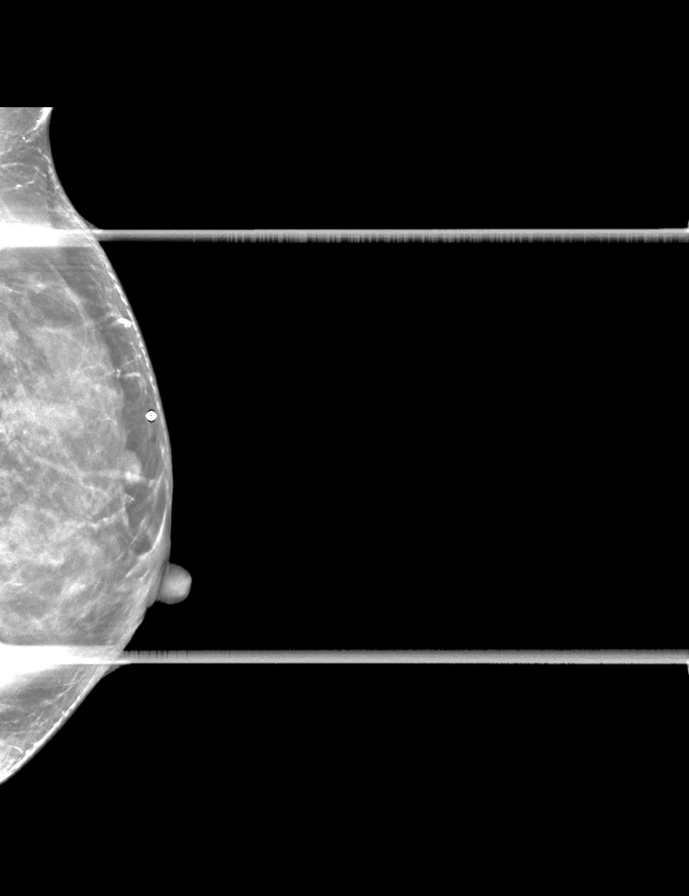

[L CC synth-2D]
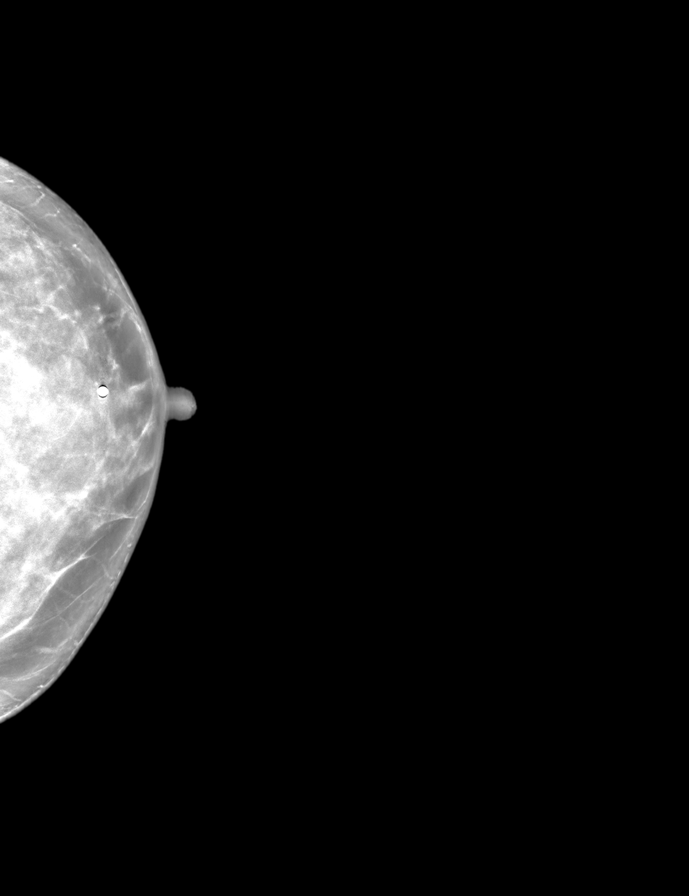

[L MLOID BREAST TOMOSYNTHESIS IMAGE tomo · tomo slice 33/64.0]
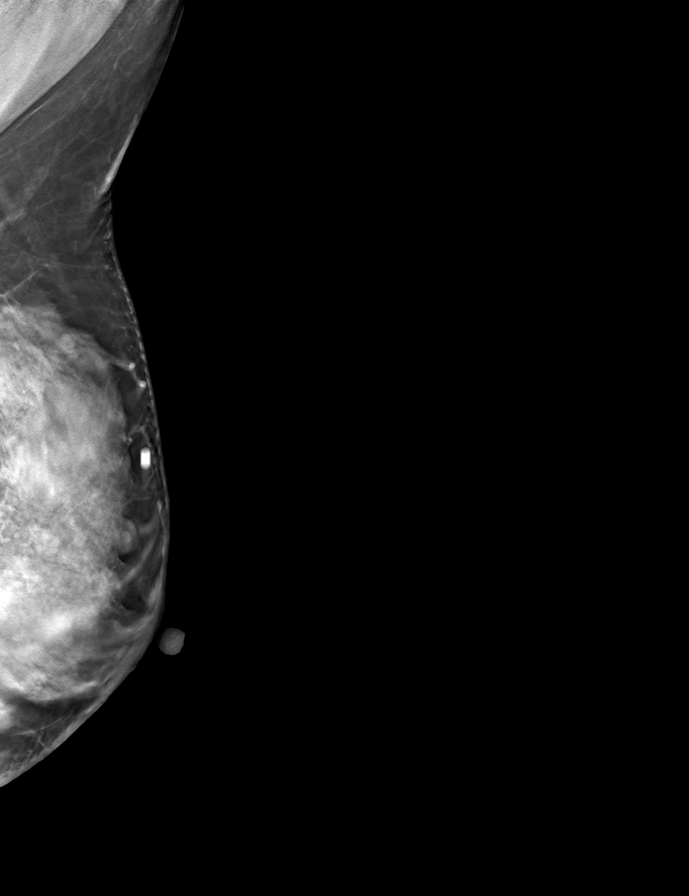

[L CCID BREAST TOMOSYNTHESIS IMAGE tomo · tomo slice 32/63.0]
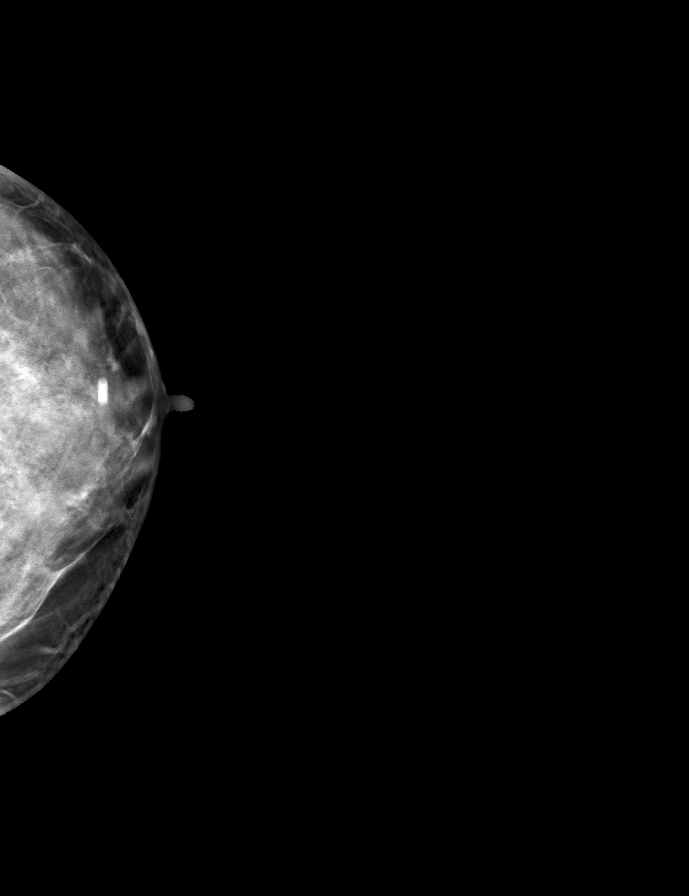

[L TANID BREAST TOMOSYNTHESIS IMAGE tomo · tomo slice 29/57.0]
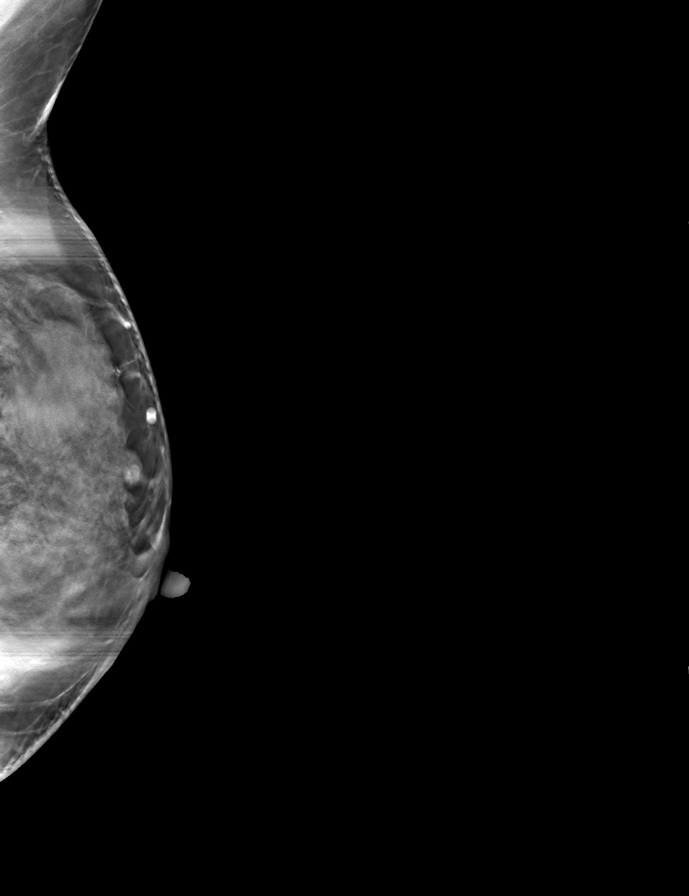

[8 of 20 positions shown; findings below may reference images not displayed]

ACR Breast Density Category d: The breast tissue is extremely dense,
which lowers the sensitivity of mammography.
FINDINGS: Mammogram:

The patient has a retroglandular implant. A skin BB marks the
palpable site of concern reported by the patient in the superior
central breast. At the palpable site there is an oval circumscribed
mass measuring approximately 0.9 cm. There are no additional new
findings elsewhere in the left breast.

On physical exam at the site of concern reported by the patient I
feel a discrete mobile mass.

Ultrasound:

Targeted ultrasound performed at the palpable site of concern
reported by the patient in the left breast at 12 o'clock 4 cm from
the nipple demonstrating an oval circumscribed anechoic mass with
posterior enhancement measuring 2.8 x 1.2 x 3.0 cm, consistent with
a benign simple cyst. There is a similar appearing smaller mass
adjacent to this measuring 0.6 x 0.5 x 0.5 cm. At 12:30 o'clock 2 cm
from the nipple there is an oval circumscribed mass with internal
echoes measuring 0.8 x 0.5 x 0.9 cm. No internal vascularity. This
likely represents a complicated cyst. Targeted ultrasound of the
left axilla demonstrates normal lymph nodes.
IMPRESSION: 1. At the palpable site of concern in the left breast at 12 o'clock
there are adjacent benign simple cysts.

2. Incidentally noted at 12:30 o'clock there is a probably benign
mass, likely a complicated cyst.

RECOMMENDATION:
Left breast ultrasound in 6 months.

I have discussed the findings and recommendations with the patient
who agrees to short-term follow-up. If applicable, a reminder letter
will be sent to the patient regarding the next appointment.

BI-RADS CATEGORY  3: Probably benign.

## 2022-06-10 ENCOUNTER — Emergency Department (HOSPITAL_COMMUNITY)
Admission: EM | Admit: 2022-06-10 | Discharge: 2022-06-10 | Disposition: A | Discharge status: Home / Self Care | Payer: Commercial Managed Care - HMO | Attending: Emergency Medicine | Admitting: Emergency Medicine

## 2022-06-10 ENCOUNTER — Emergency Department (HOSPITAL_COMMUNITY): Payer: Commercial Managed Care - HMO

## 2022-06-10 DIAGNOSIS — Y92410 Unspecified street and highway as the place of occurrence of the external cause: Secondary | ICD-10-CM | POA: Diagnosis not present

## 2022-06-10 DIAGNOSIS — R519 Headache, unspecified: Secondary | ICD-10-CM | POA: Diagnosis not present

## 2022-06-10 DIAGNOSIS — S161XXA Strain of muscle, fascia and tendon at neck level, initial encounter: Secondary | ICD-10-CM | POA: Diagnosis not present

## 2022-06-10 DIAGNOSIS — R202 Paresthesia of skin: Secondary | ICD-10-CM | POA: Diagnosis not present

## 2022-06-10 DIAGNOSIS — M542 Cervicalgia: Secondary | ICD-10-CM | POA: Diagnosis present

## 2022-06-10 MED ORDER — CYCLOBENZAPRINE HCL 10 MG PO TABS
10.0000 mg | ORAL_TABLET | Freq: Once | ORAL | Status: AC
  Administered 2022-06-10: 10 mg via ORAL
  Filled 2022-06-10: qty 1

## 2022-06-10 MED ORDER — ACETAMINOPHEN 500 MG PO TABS
1000.0000 mg | ORAL_TABLET | Freq: Once | ORAL | Status: AC
  Administered 2022-06-10: 1000 mg via ORAL
  Filled 2022-06-10: qty 2

## 2022-06-10 MED ORDER — CYCLOBENZAPRINE HCL 10 MG PO TABS: 10.0000 mg | ORAL_TABLET | Freq: Two times a day (BID) | ORAL | 0 refills | Status: AC | PRN
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
## 2023-02-03 ENCOUNTER — Telehealth: Payer: Managed Care, Other (non HMO) | Admitting: Physician Assistant

## 2023-02-03 ENCOUNTER — Encounter: Payer: Self-pay | Admitting: Physician Assistant

## 2023-02-03 VITALS — BP 124/86 | HR 101 | Temp 98.9°F

## 2023-02-03 DIAGNOSIS — J00 Acute nasopharyngitis [common cold]: Secondary | ICD-10-CM | POA: Diagnosis not present

## 2023-02-03 LAB — POC COVID19 BINAXNOW: SARS Coronavirus 2 Ag: NEGATIVE

## 2023-02-03 MED ORDER — BENZONATATE 100 MG PO CAPS: ORAL_CAPSULE | ORAL | 0 refills | Status: AC

## 2023-02-03 MED ORDER — FLUTICASONE PROPIONATE 50 MCG/ACT NA SUSP: 2.0000 | Freq: Every day | NASAL | 6 refills | Status: AC

## 2023-02-03 MED ORDER — CETIRIZINE HCL 10 MG PO TABS: 10.0000 mg | ORAL_TABLET | Freq: Every day | ORAL | 11 refills | Status: AC

## 2023-02-03 NOTE — Progress Notes (Signed)
New Patient Office Visit  Subjective    Patient ID: Tracey Coleman, female    DOB: April 30, 1975  Age: 47 y.o. MRN: [id number]  CC:  Chief Complaint  Patient presents with   Sore Throat   Sinusitis   Generalized Body Aches   Headache   Virtual Visit via Video Note  I connected with Tracey Coleman on 02/03/23 at  1:00 PM EST by a video enabled telemedicine application and verified that I am speaking with the correct person using two identifiers.  Location: Patient: Car  Provider: [facility]    I discussed the limitations of evaluation and management by telemedicine and the availability of in person appointments. The patient expressed understanding and agreed to proceed.  History of Present Illness: States that she has been experiencing a stuffy nose, body aches, dry cough, and headache since last night.  Denies fever or chills.  States that she has been using Tylenol Cold along with mentholated patch with modest relief.  States that she is eating and drinking okay.  States that she does not have any other sick contacts other than her husband who has been experiencing similar symptoms for the same amount of time.   Observations/Objective:  Medical history and current medications reviewed, no physical exam completed      Outpatient Encounter Medications as of 02/03/2023  Medication Sig   benzonatate (TESSALON) 100 MG capsule Take 1-2 caps PO TID PRN   cetirizine (ZYRTEC ALLERGY) 10 MG tablet Take 1 tablet (10 mg total) by mouth daily.   fluticasone (FLONASE) 50 MCG/ACT nasal spray Place 2 sprays into both nostrils daily.   chlorproMAZINE (THORAZINE) 10 MG tablet Take 10 mg by mouth 3 (three) times daily. (Patient not taking: Reported on 11/23/2021)   cyclobenzaprine (FLEXERIL) 10 MG tablet Take 1 tablet (10 mg total) by mouth 2 (two) times daily as needed for up to 14 doses for muscle spasms.   naproxen (NAPROSYN) 250 MG tablet Take 250 mg by mouth 2  (two) times daily.   ondansetron (ZOFRAN) 4 MG tablet Take 1 tablet (4 mg total) by mouth every 8 (eight) hours as needed for nausea. (Patient not taking: Reported on 11/23/2021)   ondansetron (ZOFRAN) 4 MG tablet Take 1 tablet (4 mg total) by mouth every 8 (eight) hours as needed for nausea or vomiting.   Facility-Administered Encounter Medications as of 02/03/2023  Medication   0.9 %  sodium chloride infusion   gadopentetate dimeglumine (MAGNEVIST) injection 13 mL    Past Medical History:  Diagnosis Date   Dyspnea    GERD (gastroesophageal reflux disease)    Headaches due to old head injury     Past Surgical History:  Procedure Laterality Date   AUGMENTATION MAMMAPLASTY Bilateral 2012   CRANIOTOMY  11/23/2021   Procedure: CRANIOTOMY HEMATOMA EVACUATION SUBDURAL;  Surgeon: Lisbeth Renshaw, MD;  Location: [facility] OR;  Service: Neurosurgery;;   LAPAROSCOPIC VAGINAL HYSTERECTOMY WITH SALPINGECTOMY Bilateral 12/11/2016   Procedure: LAPAROSCOPIC ASSISTED VAGINAL HYSTERECTOMY WITH SALPINGECTOMY;  Surgeon: Richarda Overlie, MD;  Location: [facility];  Service: Gynecology;  Laterality: Bilateral;   PLACEMENT OF BREAST IMPLANTS      Family History  Problem Relation Age of Onset   Diabetes Mother    Hypertension Mother    Cancer Brother    Seizures Neg Hx    Migraines Neg Hx     Social History   Socioeconomic History   Marital status: Single    Spouse name: Not on  file   Number of children: 3   Years of education: 11   Highest education level: Not on file  Occupational History   Occupation: [employer]  Tobacco Use   Smoking status: Never   Smokeless tobacco: Never  Substance and Sexual Activity   Alcohol use: No    Alcohol/week: 0.0 standard drinks of alcohol   Drug use: No   Sexual activity: Yes    Birth control/protection: Other-see comments    Comment: Hysterectomy 2018  Other Topics Concern   Not on file  Social History Narrative   Lives with parents  and kids   Caffeine use: Drink coffee (once per day)   Social Determinants of Health   Financial Resource Strain: Not on file  Food Insecurity: No Food Insecurity (11/24/2021)   Hunger Vital Sign    Worried About Running Out of Food in the Last Year: Never true    Ran Out of Food in the Last Year: Never true  Transportation Needs: No Transportation Needs (11/24/2021)   PRAPARE - Administrator, Civil Service (Medical): No    Lack of Transportation (Non-Medical): No  Physical Activity: Not on file  Stress: Not on file  Social Connections: Unknown (07/30/2021)   [facility]   Social Network    Social Network: Not on file  Intimate Partner Violence: Not At Risk (11/24/2021)   Humiliation, Afraid, Rape, and Kick questionnaire    Fear of Current or Ex-Partner: No    Emotionally Abused: No    Physically Abused: No    Sexually Abused: No    Review of Systems  Constitutional:  Negative for chills and fever.  HENT:  Positive for congestion. Negative for ear pain, sinus pain and sore throat.   Eyes: Negative.   Respiratory:  Positive for cough. Negative for shortness of breath.   Cardiovascular:  Negative for chest pain.  Gastrointestinal:  Negative for abdominal pain, nausea and vomiting.  Genitourinary: Negative.   Musculoskeletal:  Positive for myalgias.  Skin: Negative.   Neurological:  Positive for headaches.  Endo/Heme/Allergies: Negative.   Psychiatric/Behavioral: Negative.          Objective    BP 124/86 (BP Location: Left Arm, Patient Position: Sitting, Cuff Size: Large)   Pulse (!) 101   Temp 98.9 F (37.2 C)   LMP 09/15/2016 (Within Days)      Assessment & Plan:   Problem List Items Addressed This Visit   None Visit Diagnoses     Acute rhinitis    -  Primary   Relevant Medications   cetirizine (ZYRTEC ALLERGY) 10 MG tablet   fluticasone (FLONASE) 50 MCG/ACT nasal spray   benzonatate (TESSALON) 100 MG capsule    Other Relevant Orders   POC COVID-19 (Completed)      Assessment and Plan:  1. Acute rhinitis Rapid COVID-negative.  Trial Zyrtec, Flonase, Tessalon Perles.  Patient education given on supportive care, red flags given for prompt reevaluation. - POC COVID-19 - cetirizine (ZYRTEC ALLERGY) 10 MG tablet; Take 1 tablet (10 mg total) by mouth daily.  Dispense: 30 tablet; Refill: 11 - fluticasone (FLONASE) 50 MCG/ACT nasal spray; Place 2 sprays into both nostrils daily.  Dispense: 16 g; Refill: 6 - benzonatate (TESSALON) 100 MG capsule; Take 1-2 caps PO TID PRN  Dispense: 20 capsule; Refill: 0  Follow Up Instructions:    I discussed the assessment and treatment plan with the patient. The patient was provided an opportunity to ask  questions and all were answered. The patient agreed with the plan and demonstrated an understanding of the instructions.   The patient was advised to call back or seek an in-person evaluation if the symptoms worsen or if the condition fails to improve as anticipated.  I provided 18 minutes of non-face-to-face time during this encounter.     Return if symptoms worsen or fail to improve.   Kasandra Knudsen Mayers, PA-C

## 2023-02-03 NOTE — Patient Instructions (Signed)
You are going to use Zyrtec, Flonase, and Tessalon Perles to help alleviate your symptoms.  Make sure that you are staying well-hydrated and get plenty of rest.  I hope that you feel better soon, please let us know if there is anything else we can do for you.  Roney Jaffe, PA-C Physician Assistant [facility] [email or web address] Nonallergic Rhinitis Nonallergic rhinitis is inflammation of the mucous membrane inside the nose. The mucous membrane is the tissue that produces mucus. This condition is different from having allergic rhinitis, which is an allergy that affects the nose. Allergic rhinitis occurs when the body's defense system, or immune system, reacts to a substance that a person is allergic to (allergen), such as pollen, pet dander, mold, or dust. Nonallergic rhinitis has many similar symptoms, but it is not caused by allergens. Nonallergic rhinitis can be an acute or chronic problem. This means it can be short-term or long-term. What are the causes? This condition may be caused by many different things. Some common types of nonallergic rhinitis include: Infectious rhinitis. This is usually caused by an infection in the nose, throat, or upper airways (upper respiratory system). Vasomotor rhinitis. This is the most common type. It is caused by too much blood flow through your nose, and makes your nose swell. It is triggered by strong odors, cold air, stress, drinking alcohol, cigarette smoke, or changes in the weather. Occupational rhinitis. This type is caused by triggers in the workplace, such as chemicals, dust, animal dander, or air pollution. Hormonal rhinitis, in female teens and adults. This type is caused by an increase in the hormone estrogen and may happen during pregnancy, puberty, or monthly menstrual periods. Hormonal rhinitis gives you fewer symptoms when estrogen levels drop. Drug-induced rhinitis. Several types of  medicines can cause this, such as medicines for high blood pressure or heart disease, aspirin, or NSAIDs. Nonallergic rhinitis with eosinophilia syndrome (NARES). This type is caused by having too much eosinophil, a type of white blood cell. Other causes include a reaction to eating hot or spicy foods. This does not usually cause long-term symptoms. In some cases, the cause of nonallergic rhinitis is not known. What increases the risk? You are more likely to develop this condition if: You are 33-67 years of age. You are female. People who are female are twice as likely to have this condition. What are the signs or symptoms? Common symptoms of this condition include: Stuffy nose (nasal congestion). Runny nose. A feeling of mucus dripping down the back of your throat (postnasal drip). Trouble sleeping. Tiredness, or fatigue. Other symptoms include: Sneezing. Coughing. Itchy nose. Bloodshot eyes. How is this diagnosed? This type may be diagnosed based on: Your symptoms and medical history. A physical exam. Allergy testing to rule out allergic rhinitis. You may have skin tests or blood tests. Your health care provider may also take a swab of nasal discharge to look for an increased number of eosinophils. This is done to confirm a diagnosis of NARES. How is this treated? Treatment for this condition depends on the cause. No single treatment works for everyone. Work with your provider to find the best treatment for you. Treatment may include: Avoiding the things that trigger your symptoms. Medicines to relieve congestion, such as: Steroid nasal spray. There are many types. You may need to try a few to find out which one works best. Engineer, civil (consulting) medicine. This treats nasal congestion and may be given by mouth or as a nasal spray. These medicines are  used only for a short time. Medicines to relieve a runny nose. These may include antihistamine medicines or decongestant nasal sprays. Nasal  irrigation. This involves using a salt-water (saline) spray or saline container called a neti pot. Nasal irrigation helps to clear away mucus and keep your nasal passages moist. Surgery to remove part of your mucous membrane. This is done in severe cases if the condition has not improved after 6-12 months of treatment. Follow these instructions at home: Medicines Take or use over-the-counter and prescription medicines only as told by your provider. Do not stop using your medicine even if you start to feel better. Do not take NSAIDs, such as ibuprofen, or medicines that contain aspirin if they make your symptoms worse. Lifestyle Do not drink alcohol if it makes your symptoms worse. Do not use any products that contain nicotine or tobacco. These products include cigarettes, chewing tobacco, and vaping devices, such as e-cigarettes. If you need help quitting, ask your provider. Avoid secondhand smoke. General instructions Avoid triggers that make your symptoms worse. Use nasal irrigation as told by your provider. Sleep with the head of your bed raised. This may reduce nasal congestion when you sleep. Drink enough fluid to keep your pee (urine) pale yellow. Contact a health care provider if: You have a fever. Your symptoms are getting worse at home. Your symptoms do not lessen with medicine. You develop new symptoms, especially a headache or nosebleed. Get help right away if: You have difficulty breathing. This symptom may be an emergency. Get help right away. Call 911. Do not wait to see if the symptoms will go away. Do not drive yourself to the hospital. This information is not intended to replace advice given to you by your health care provider. Make sure you discuss any questions you have with your health care provider. Document Revised: 11/05/2021 Document Reviewed: 11/05/2021 Elsevier Patient Education  2024 [employer].
